# Patient Record
Sex: Female | Born: 2000 | Race: Black or African American | Hispanic: No | Marital: Single | Smoking: Never smoker
Health system: Southern US, Community
[De-identification: ages and names within clinical notes are randomized; demographics above are authoritative.]

## PROBLEM LIST (undated history)

## (undated) DIAGNOSIS — F640 Transsexualism: Secondary | ICD-10-CM

## (undated) DIAGNOSIS — A0472 Enterocolitis due to Clostridium difficile, not specified as recurrent: Secondary | ICD-10-CM

## (undated) DIAGNOSIS — Z789 Other specified health status: Secondary | ICD-10-CM

## (undated) DIAGNOSIS — K828 Other specified diseases of gallbladder: Secondary | ICD-10-CM

## (undated) DIAGNOSIS — F419 Anxiety disorder, unspecified: Secondary | ICD-10-CM

## (undated) DIAGNOSIS — E538 Deficiency of other specified B group vitamins: Secondary | ICD-10-CM

## (undated) DIAGNOSIS — F322 Major depressive disorder, single episode, severe without psychotic features: Secondary | ICD-10-CM

## (undated) DIAGNOSIS — J45909 Unspecified asthma, uncomplicated: Secondary | ICD-10-CM

## (undated) DIAGNOSIS — F909 Attention-deficit hyperactivity disorder, unspecified type: Secondary | ICD-10-CM

## (undated) DIAGNOSIS — F0781 Postconcussional syndrome: Secondary | ICD-10-CM

## (undated) HISTORY — PX: ULNAR NERVE REPAIR: SHX2594

## (undated) HISTORY — DX: Anxiety disorder, unspecified: F41.9

## (undated) HISTORY — DX: Enterocolitis due to Clostridium difficile, not specified as recurrent: A04.72

## (undated) HISTORY — DX: Other specified diseases of gallbladder: K82.8

## (undated) HISTORY — DX: Attention-deficit hyperactivity disorder, unspecified type: F90.9

## (undated) HISTORY — DX: Deficiency of other specified B group vitamins: E53.8

## (undated) HISTORY — DX: Postconcussional syndrome: F07.81

## (undated) HISTORY — PX: OTHER SURGICAL HISTORY: SHX169

## (undated) HISTORY — PX: LIGAMENT REPAIR: SHX5444

## (undated) HISTORY — PX: CHOLECYSTECTOMY: SHX55

## (undated) HISTORY — DX: Major depressive disorder, single episode, severe without psychotic features: F32.2

## (undated) HISTORY — PX: WISDOM TOOTH EXTRACTION: SHX21

---
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
## 2018-03-23 ENCOUNTER — Encounter (HOSPITAL_COMMUNITY): Payer: Self-pay | Admitting: *Deleted

## 2018-03-23 ENCOUNTER — Other Ambulatory Visit: Payer: Self-pay

## 2018-03-23 ENCOUNTER — Emergency Department (HOSPITAL_COMMUNITY)
Admission: EM | Admit: 2018-03-23 | Discharge: 2018-03-23 | Disposition: A | Discharge status: Home / Self Care | Payer: BLUE CROSS/BLUE SHIELD | Attending: Emergency Medicine | Admitting: Emergency Medicine

## 2018-03-23 DIAGNOSIS — Y9389 Activity, other specified: Secondary | ICD-10-CM | POA: Diagnosis not present

## 2018-03-23 DIAGNOSIS — Z79899 Other long term (current) drug therapy: Secondary | ICD-10-CM | POA: Insufficient documentation

## 2018-03-23 DIAGNOSIS — Y999 Unspecified external cause status: Secondary | ICD-10-CM | POA: Diagnosis not present

## 2018-03-23 DIAGNOSIS — W208XXA Other cause of strike by thrown, projected or falling object, initial encounter: Secondary | ICD-10-CM | POA: Insufficient documentation

## 2018-03-23 DIAGNOSIS — S060X0A Concussion without loss of consciousness, initial encounter: Secondary | ICD-10-CM | POA: Insufficient documentation

## 2018-03-23 DIAGNOSIS — J45909 Unspecified asthma, uncomplicated: Secondary | ICD-10-CM | POA: Insufficient documentation

## 2018-03-23 DIAGNOSIS — S0990XA Unspecified injury of head, initial encounter: Secondary | ICD-10-CM | POA: Diagnosis present

## 2018-03-23 DIAGNOSIS — Y929 Unspecified place or not applicable: Secondary | ICD-10-CM | POA: Diagnosis not present

## 2018-03-23 NOTE — Discharge Instructions (Signed)
Tylenol or ibuprofen for headaches.  Dont take it to prevent you from getting a headache, only if you have one. Return for confusion, vomiting

## 2018-03-23 NOTE — ED Triage Notes (Signed)
Pt reports she was carrying a table over the right shoulder and when she was laying it down she hit the right side of her head. No n/v or LOC, change in vision. She reports she does feel fatigued and dizzy

## 2018-03-23 NOTE — ED Provider Notes (Signed)
[facility] EMERGENCY DEPARTMENT Provider Note   CSN: [id number] Arrival date & time: 03/23/18  1927     History   Chief Complaint Chief Complaint  Patient presents with  . Head Injury    HPI Saralynn Langhorst is a 17 y.o. female.  17 yo F with a chief complaint of a closed head injury.  The patient was carrying a table and she sat down and thinks that the table bumped against the side of her head.  This occurred about an hour and a half ago.  She is having a mild headache.  She was feeling tired and so she was concerned that she may have something wrong with her.  She denies alcohol or drug ingestion.  Denies confusion denies vomiting denies loss of consciousness.  She denies unilateral numbness or weakness.  The history is provided by the patient.  Head Injury   The incident occurred 1 to 2 hours ago. She came to the ER via walk-in. The injury mechanism was a direct blow. There was no loss of consciousness. There was no blood loss. The quality of the pain is described as dull. The pain is at a severity of 2/10. The pain is mild. The pain has been constant since the injury. Pertinent negatives include no vomiting. The treatment provided no relief.    Past Medical History:  Diagnosis Date  . Asthma     Patient Active Problem List   Diagnosis Date Noted  . MDD (major depressive disorder), single episode, severe , no psychosis (HCC) 08/18/2017    Past Surgical History:  Procedure Laterality Date  . brain cyst removal    . LIGAMENT REPAIR Left    radial     OB History   None      Home Medications    Prior to Admission medications   Medication Sig Start Date End Date Taking? Authorizing Provider  escitalopram (LEXAPRO) 20 MG tablet Take 1 tablet (20 mg total) by mouth daily. For depression 08/21/17   Armandina Stammer I, NP  loratadine (CLARITIN) 10 MG tablet Take 1 tablet (10 mg total) by mouth daily. (May buy from over the counter): For allergies 08/21/17    Armandina Stammer I, NP  traZODone (DESYREL) 50 MG tablet Take 1 tablet (50 mg total) by mouth at bedtime as needed for sleep. 08/20/17   Sanjuana Kava, NP    Family History No family history on file.  Social History Social History   Tobacco Use  . Smoking status: Never Smoker  . Smokeless tobacco: Never Used  Substance Use Topics  . Alcohol use: No    Frequency: Never  . Drug use: No     Allergies   Mold extract [trichophyton] and Zyrtec [cetirizine]   Review of Systems Review of Systems  Constitutional: Negative for chills and fever.  HENT: Negative for congestion and rhinorrhea.   Eyes: Negative for redness and visual disturbance.  Respiratory: Negative for shortness of breath and wheezing.   Cardiovascular: Negative for chest pain and palpitations.  Gastrointestinal: Negative for nausea and vomiting.  Genitourinary: Negative for dysuria and urgency.  Musculoskeletal: Negative for arthralgias and myalgias.  Skin: Negative for pallor and wound.  Neurological: Positive for headaches. Negative for dizziness.     Physical Exam Updated Vital Signs BP (!) 126/94 (BP Location: Right Arm)   Pulse 70   Temp 98.9 F (37.2 C) (Oral)   Resp 16   LMP 03/22/2018   SpO2 98%   Physical  Exam  Constitutional: She is oriented to person, place, and time. She appears well-developed and well-nourished. No distress.  HENT:  Head: Normocephalic and atraumatic. Head is without raccoon's eyes and without Battle's sign.  No hemotympanum  Eyes: Pupils are equal, round, and reactive to light. EOM are normal.  Neck: Normal range of motion. Neck supple.  Cardiovascular: Normal rate and regular rhythm. Exam reveals no gallop and no friction rub.  No murmur heard. Pulmonary/Chest: Effort normal. She has no wheezes. She has no rales.  Abdominal: Soft. She exhibits no distension and no mass. There is no tenderness. There is no guarding.  Musculoskeletal: She exhibits no edema or tenderness.    Neurological: She is alert and oriented to person, place, and time. She has normal strength. No cranial nerve deficit or sensory deficit. She displays a negative Romberg sign. Coordination and gait normal.  Benign neuro exam  Skin: Skin is warm and dry. She is not diaphoretic.  Psychiatric: She has a normal mood and affect. Her behavior is normal.  Nursing note and vitals reviewed.    ED Treatments / Results  Labs (all labs ordered are listed, but only abnormal results are displayed) Labs Reviewed - No data to display  EKG None  Radiology No results found.  Procedures Procedures (including critical care time)  Medications Ordered in ED Medications - No data to display   Initial Impression / Assessment and Plan / ED Course  I have reviewed the triage vital signs and the nursing notes.  Pertinent labs & imaging results that were available during my care of the patient were reviewed by me and considered in my medical decision making (see chart for details).     17 yo F with a chief complaint of a closed head injury.  Cleared by [place] head CT rules.  Discharge home.  7:48 PM:  I have discussed the diagnosis/risks/treatment options with the patient and friends and believe the pt to be eligible for discharge home to follow-up with PCP. We also discussed returning to the ED immediately if new or worsening sx occur. We discussed the sx which are most concerning (e.g., confusion, vomiting) that necessitate immediate return. Medications administered to the patient during their visit and any new prescriptions provided to the patient are listed below.  Medications given during this visit Medications - No data to display    The patient appears reasonably screen and/or stabilized for discharge and I doubt any other medical condition or other [facility] requiring further screening, evaluation, or treatment in the ED at this time prior to discharge.    Final Clinical Impressions(s) / ED  Diagnoses   Final diagnoses:  Concussion without loss of consciousness, initial encounter    ED Discharge Orders    None       Melene PlanFloyd, Tana Trefry, DO 03/23/18 1948

## 2018-07-20 ENCOUNTER — Emergency Department (HOSPITAL_COMMUNITY): Payer: BLUE CROSS/BLUE SHIELD

## 2018-07-20 ENCOUNTER — Emergency Department (HOSPITAL_COMMUNITY)
Admission: EM | Admit: 2018-07-20 | Discharge: 2018-07-20 | Disposition: A | Discharge status: Home / Self Care | Payer: BLUE CROSS/BLUE SHIELD | Attending: Emergency Medicine | Admitting: Emergency Medicine

## 2018-07-20 ENCOUNTER — Other Ambulatory Visit: Payer: Self-pay

## 2018-07-20 ENCOUNTER — Encounter (HOSPITAL_COMMUNITY): Payer: Self-pay | Admitting: Emergency Medicine

## 2018-07-20 DIAGNOSIS — Y9351 Activity, roller skating (inline) and skateboarding: Secondary | ICD-10-CM | POA: Diagnosis not present

## 2018-07-20 DIAGNOSIS — M25562 Pain in left knee: Secondary | ICD-10-CM | POA: Diagnosis not present

## 2018-07-20 DIAGNOSIS — S99912A Unspecified injury of left ankle, initial encounter: Secondary | ICD-10-CM | POA: Diagnosis present

## 2018-07-20 DIAGNOSIS — S93402A Sprain of unspecified ligament of left ankle, initial encounter: Secondary | ICD-10-CM | POA: Insufficient documentation

## 2018-07-20 DIAGNOSIS — Y929 Unspecified place or not applicable: Secondary | ICD-10-CM | POA: Insufficient documentation

## 2018-07-20 DIAGNOSIS — J45909 Unspecified asthma, uncomplicated: Secondary | ICD-10-CM | POA: Insufficient documentation

## 2018-07-20 DIAGNOSIS — Z79899 Other long term (current) drug therapy: Secondary | ICD-10-CM | POA: Insufficient documentation

## 2018-07-20 DIAGNOSIS — Y999 Unspecified external cause status: Secondary | ICD-10-CM | POA: Insufficient documentation

## 2018-07-20 MED ORDER — IBUPROFEN 800 MG PO TABS: 800.0000 mg | ORAL_TABLET | Freq: Three times a day (TID) | ORAL | 0 refills | Status: DC

## 2018-07-20 NOTE — ED Triage Notes (Signed)
Patient complaining of falling down on left knee and twisting left ankle while trying to do tricks on a skateboard.

## 2018-07-20 NOTE — ED Provider Notes (Signed)
[facility]-EMERGENCY DEPT Provider Note   CSN: [id number] Arrival date & time: 07/20/18  0047    History   Chief Complaint Chief Complaint  Patient presents with  . Ankle Injury  . Knee Injury    HPI [name] is a 18 y.o. female.     Patient presents to the emergency department with a chief complaint of skateboarding injury.  She fell while skateboarding earlier this evening.  She complains of left knee and left ankle pain.  She has been able to ambulate, but states that it is painful.  Denies any treatments prior to arrival.  Denies any numbness, weakness, tingling.  Denies any other associated symptoms.  The history is provided by the patient. No language interpreter was used.    Past Medical History:  Diagnosis Date  . Asthma     Patient Active Problem List   Diagnosis Date Noted  . MDD (major depressive disorder), single episode, severe , no psychosis (HCC) 08/18/2017    Past Surgical History:  Procedure Laterality Date  . brain cyst removal    . LIGAMENT REPAIR Left    radial     OB History   No obstetric history on file.      Home Medications    Prior to Admission medications   Medication Sig Start Date End Date Taking? Authorizing Provider  escitalopram (LEXAPRO) 20 MG tablet Take 1 tablet (20 mg total) by mouth daily. For depression 08/21/17   Armandina Stammer I, NP  ibuprofen (ADVIL,MOTRIN) 800 MG tablet Take 1 tablet (800 mg total) by mouth 3 (three) times daily. 07/20/18   Roxy Horseman, PA-C  loratadine (CLARITIN) 10 MG tablet Take 1 tablet (10 mg total) by mouth daily. (May buy from over the counter): For allergies 08/21/17   Armandina Stammer I, NP  traZODone (DESYREL) 50 MG tablet Take 1 tablet (50 mg total) by mouth at bedtime as needed for sleep. 08/20/17   Sanjuana Kava, NP    Family History History reviewed. No pertinent family history.  Social History Social History   Tobacco Use  . Smoking status: Never Smoker  .  Smokeless tobacco: Never Used  Substance Use Topics  . Alcohol use: No    Frequency: Never  . Drug use: No     Allergies   Mold extract [trichophyton]; Pollen extract; and Zyrtec [cetirizine]   Review of Systems Review of Systems  All other systems reviewed and are negative.    Physical Exam Updated Vital Signs BP (!) 121/93 (BP Location: Left Arm)   Pulse 60   Temp 98.8 F (37.1 C) (Oral)   Resp 16   Ht 5\' 10"  (1.778 m)   Wt 95.3 kg   LMP 06/28/2018   SpO2 100%   BMI 30.13 kg/m   Physical Exam Nursing note and vitals reviewed.  Constitutional: Pt appears well-developed and well-nourished. No distress.  HENT:  Head: Normocephalic and atraumatic.  Eyes: Conjunctivae are normal.  Neck: Normal range of motion.  Cardiovascular: Normal rate, regular rhythm. Intact distal pulses.   Capillary refill < 3 sec.  Pulmonary/Chest: Effort normal and breath sounds normal.  Musculoskeletal:  Left lower extremity Pt exhibits no bony abnormality or deformity about the left knee or ankle, but there is tenderness about the left ankle on the medial and lateral aspects.   ROM: Limited in the left ankle, normal and left knee  Strength: 5/5 but painful Neurological: Pt  is alert. Coordination normal.  Sensation: 5/5 Skin: Skin  is warm and dry. Pt is not diaphoretic.  No evidence of open wound or skin tenting Psychiatric: Pt has a normal mood and affect.     ED Treatments / Results  Labs (all labs ordered are listed, but only abnormal results are displayed) Labs Reviewed - No data to display  EKG None  Radiology Dg Tibia/fibula Left  Result Date: 07/20/2018 CLINICAL DATA:  Injury EXAM: LEFT TIBIA AND FIBULA - 2 VIEW COMPARISON:  None. FINDINGS: The ankle is non included. There is no evidence of fracture or other focal bone lesions. Soft tissues are unremarkable. IMPRESSION: Negative. Electronically Signed   By: Jasmine Pang M.D.   On: 07/20/2018 01:59   Dg Ankle Complete  Left  Result Date: 07/20/2018 CLINICAL DATA:  Injury EXAM: LEFT ANKLE COMPLETE - 3+ VIEW COMPARISON:  None. FINDINGS: There is no evidence of fracture, dislocation, or joint effusion. There is no evidence of arthropathy or other focal bone abnormality. Soft tissues are unremarkable. IMPRESSION: Negative. Electronically Signed   By: Jasmine Pang M.D.   On: 07/20/2018 01:59   Dg Knee Complete 4 Views Left  Result Date: 07/20/2018 CLINICAL DATA:  Injury EXAM: LEFT KNEE - COMPLETE 4+ VIEW COMPARISON:  None. FINDINGS: No evidence of fracture, dislocation, or joint effusion. No evidence of arthropathy or other focal bone abnormality. Soft tissues are unremarkable. IMPRESSION: Negative. Electronically Signed   By: Jasmine Pang M.D.   On: 07/20/2018 01:59    Procedures Procedures (including critical care time)  Medications Ordered in ED Medications - No data to display   Initial Impression / Assessment and Plan / ED Course  I have reviewed the triage vital signs and the nursing notes.  Pertinent labs & imaging results that were available during my care of the patient were reviewed by me and considered in my medical decision making (see chart for details).        Patient presents with injury to left ankle and left knee.  DDx includes, fracture, strain, or sprain.  Consultants: None  Plain films reveal negative.  Pt advised to follow up with PCP and/or orthopedics. Patient given ankle ASO and crutches while in ED, conservative therapy such as RICE recommended and discussed.   Patient will be discharged home & is agreeable with above plan. Returns precautions discussed. Pt appears safe for discharge.   Final Clinical Impressions(s) / ED Diagnoses   Final diagnoses:  Sprain of left ankle, unspecified ligament, initial encounter  Acute pain of left knee    ED Discharge Orders         Ordered    ibuprofen (ADVIL,MOTRIN) 800 MG tablet  3 times daily     07/20/18 0233             Roxy Horseman, PA-C 07/20/18 0235    Ward, Layla Maw, DO 07/20/18 [phone]

## 2018-08-16 ENCOUNTER — Encounter (HOSPITAL_COMMUNITY): Payer: Self-pay

## 2018-08-16 ENCOUNTER — Other Ambulatory Visit: Payer: Self-pay

## 2018-08-16 ENCOUNTER — Emergency Department (HOSPITAL_COMMUNITY): Payer: BLUE CROSS/BLUE SHIELD

## 2018-08-16 ENCOUNTER — Emergency Department (HOSPITAL_COMMUNITY)
Admission: EM | Admit: 2018-08-16 | Discharge: 2018-08-16 | Disposition: A | Discharge status: Home / Self Care | Payer: BLUE CROSS/BLUE SHIELD | Attending: Emergency Medicine | Admitting: Emergency Medicine

## 2018-08-16 DIAGNOSIS — Z79899 Other long term (current) drug therapy: Secondary | ICD-10-CM | POA: Insufficient documentation

## 2018-08-16 DIAGNOSIS — S93402A Sprain of unspecified ligament of left ankle, initial encounter: Secondary | ICD-10-CM | POA: Diagnosis not present

## 2018-08-16 DIAGNOSIS — Y929 Unspecified place or not applicable: Secondary | ICD-10-CM | POA: Diagnosis not present

## 2018-08-16 DIAGNOSIS — S99912A Unspecified injury of left ankle, initial encounter: Secondary | ICD-10-CM | POA: Diagnosis present

## 2018-08-16 DIAGNOSIS — Z8709 Personal history of other diseases of the respiratory system: Secondary | ICD-10-CM | POA: Insufficient documentation

## 2018-08-16 DIAGNOSIS — M25562 Pain in left knee: Secondary | ICD-10-CM | POA: Diagnosis not present

## 2018-08-16 DIAGNOSIS — Y999 Unspecified external cause status: Secondary | ICD-10-CM | POA: Diagnosis not present

## 2018-08-16 DIAGNOSIS — Y9351 Activity, roller skating (inline) and skateboarding: Secondary | ICD-10-CM | POA: Insufficient documentation

## 2018-08-16 MED ORDER — ACETAMINOPHEN 500 MG PO TABS
1000.0000 mg | ORAL_TABLET | Freq: Once | ORAL | Status: AC
  Administered 2018-08-16: 18:00:00 1000 mg via ORAL
  Filled 2018-08-16: qty 2

## 2018-08-16 NOTE — ED Triage Notes (Addendum)
Pt arrives due to fall of skateboard 30 minutes ago. Pt reports losing balance and falling. Pt heard crack in left ankle. Pt reports pain to left knee and left ankle. Pt denies passing out and denies head/neck, or back pain. Pt is alert and oriented. Pt able to wiggle toes.

## 2018-08-16 NOTE — ED Notes (Signed)
ED Provider at bedside. 

## 2018-08-16 NOTE — ED Notes (Signed)
Pt verbalized understanding of all d/c instructions and f/u information. Opportunity for questioning and answers provided. VSS. All belongings with pt at this time. Pt wheeled to lobby by this RN

## 2018-08-16 NOTE — ED Notes (Signed)
Patient reports she had recently sprained her L ankle and has both ASO ankle brace and crutches at home. Patient reports she would prefer to use her brace and crutches.

## 2018-08-16 NOTE — Discharge Instructions (Signed)
Follow up with your Primary Care Doctor as needed.   You can take Tylenol or Ibuprofen as directed for pain. You can alternate Tylenol and Ibuprofen every 4 hours. If you take Tylenol at 1pm, then you can take Ibuprofen at 5pm. Then you can take Tylenol again at 9pm. Do not exceed 4000 mg of tylenol a day. Do not exceed 800 mg of ibuprofen a day.     Return to the Emergency Department immediately for any worsening pain, redness/swelling of the ankle, gray or blue color to the toes, numbness/weakness of toes or foot, difficulty walking or any other worsening or concerning symptoms.    Ankle sprain Ankle sprain occurs when the ligaments that hold the ankle joint to get her are stretched or torn. It may take 4-6 weeks to heal.  For activity: Use crutches with nonweightbearing for the first few days. Then, you may walk on your ankles as the pain allows, or as instructed. Start gradually with weight bearing on the affected ankle. Once you can walk pain free, then try jogging. When you can run forwards, then you can try moving side to side. If you cannot walk without crutches in one week, you need a recheck by your Family Doctor.  If you do not have a family doctor to followup with, you can see the list of phone numbers below. Please call today to make a followup appointment.   RICE therapy:  Routine Care for injuries  Rest, Ice, Compression, Elevation (RICE)  Rest is needed to allow your body to heal. Routine activities can be resumed when comfortable. Injury tendons and bones can take up to 6 weeks to heal. Tendons are cordlike structures that attach muscles and bones.  Ice following an injury helps keep the swelling down and reduce the pain. Put ice in a plastic bag. Place a towel between your skin and the bag of ice. Leave the ice on for 15-20 minutes, 3-4 times a day. Do this while awake, for the first 24-48 hours. After that continue as directed by your caregiver.  Compression helps keep  swelling down. It also gives support and helps with discomfort. If any lasting bandage has been applied, it should be removed and reapplied every 3-4 hours. It should not be applied tightly, but firmly enough to keep swelling down. Watch fingers or toes for swelling, discoloration, coldness, numbness or excessive pain. If any of these problems occur, removed the bandage and reapply loosely. Contact your caregiver if these problems continue.  Elevation helps reduce swelling and decrease your pain. With extremities such as the arms, hands, legs and feet, the injured area should be placed near or above the level of the heart if possible.   

## 2018-08-16 NOTE — ED Provider Notes (Signed)
[facility] Provider [facility]   CSN: [id number] Arrival date & time: 08/16/18  1640    History   Chief Complaint Chief Complaint  Patient presents with  . Ankle Pain    HPI [name] is a 18 y.o. female with PMH/o Asthma presents for evaluation of left ankle pain after mechanical fall off a skateboard patient reports that she was states that she fell.  She thinks she bent her foot forward.  She states that since then, she is not been able to ambulate or bear weight left ankle.  Patient reports she is now wearing a helmet.  She is not hit her head.  She denies any LOC.  She did hit her face and has a little bit of a swollen lip.  States that 1 of her tooth feels slightly loose but dentition appears intact.  Patient states that she is not currently on blood thinners.  She states she has some tingling sensation noted to the bottom of her left foot.  Patient states she has not any vision changes, neck pain, chest pain, difficulty breathing, abdominal pain, nausea/vomiting.     The history is provided by the patient.    Past Medical History:  Diagnosis Date  . Asthma     Patient Active Problem List   Diagnosis Date Noted  . MDD (major depressive disorder), single episode, severe , no psychosis (HCC) 08/18/2017    Past Surgical History:  Procedure Laterality Date  . brain cyst removal    . LIGAMENT REPAIR Left    radial     OB History   No obstetric history on file.      Home Medications    Prior to Admission medications   Medication Sig Start Date End Date Taking? Authorizing Provider  albuterol (PROVENTIL HFA;VENTOLIN HFA) 108 (90 Base) MCG/ACT inhaler Inhale 2 puffs into the lungs every 6 (six) hours as needed for wheezing or shortness of breath.   Yes [provider]  DULoxetine (CYMBALTA) 30 MG capsule Take 1 capsule by mouth daily. 08/11/18  Yes [provider]  loratadine (CLARITIN) 10 MG tablet Take 1 tablet  (10 mg total) by mouth daily. (May buy from over the counter): For allergies 08/21/17  Yes Nwoko, Nicole KindredAgnes I, NP  Melatonin CR 3 MG TBCR Take 1 tablet by mouth daily as needed for sleep.   Yes [provider]  rizatriptan (MAXALT) 10 MG tablet Take 1 tablet by mouth daily as needed for migraine. 05/25/18  Yes [provider]  testosterone cypionate (DEPOTESTOSTERONE CYPIONATE) 200 MG/ML injection Inject 0.25 mLs into the muscle once a week. 08/11/18  Yes [provider]  traZODone (DESYREL) 50 MG tablet Take 1 tablet (50 mg total) by mouth at bedtime as needed for sleep. 08/20/17  Yes Armandina StammerNwoko, Agnes I, NP  vitamin B-12 (CYANOCOBALAMIN) 1000 MCG tablet Take 1 tablet by mouth daily. 04/04/18 05/25/19 Yes [provider]  escitalopram (LEXAPRO) 20 MG tablet Take 1 tablet (20 mg total) by mouth daily. For depression Patient not taking: Reported on 08/16/2018 08/21/17   Armandina StammerNwoko, Agnes I, NP  ibuprofen (ADVIL,MOTRIN) 800 MG tablet Take 1 tablet (800 mg total) by mouth 3 (three) times daily. Patient not taking: Reported on 08/16/2018 07/20/18   Roxy HorsemanBrowning, Robert, PA-C    Family History History reviewed. No pertinent family history.  Social History Social History   Tobacco Use  . Smoking status: Never Smoker  . Smokeless tobacco: Never Used  Substance Use Topics  .  Alcohol use: No    Frequency: Never  . Drug use: No     Allergies   Mold extract [trichophyton]; Pollen extract; and Zyrtec [cetirizine]   Review of Systems Review of Systems  Eyes: Negative for visual disturbance.  Respiratory: Negative for shortness of breath.   Cardiovascular: Negative for chest pain.  Gastrointestinal: Negative for abdominal pain, nausea and vomiting.  Musculoskeletal:       Left ankle and knee pain  Neurological: Positive for numbness.  All other systems reviewed and are negative.    Physical Exam Updated Vital Signs BP 122/89 (BP Location: Right Arm)   Pulse (!) 111   Temp  98.6 F (37 C) (Oral)   Resp 18   LMP 07/24/2018   SpO2 100%   Physical Exam Vitals signs and nursing note reviewed.  Constitutional:      Appearance: She is well-developed.  HENT:     Head: Normocephalic and atraumatic.     Comments: No tenderness palpation noted to face.  No deformity or crepitus noted.  No tenderness noted to mandible or nose.    Nose: Nose normal. No nasal deformity or nasal tenderness.     Mouth/Throat:     Comments: Dentition appears intact.  Eyes:     Comments: PERRL, EOMs intact.   Neck:     Musculoskeletal: Normal range of motion.  Cardiovascular:     Pulses:          Dorsalis pedis pulses are 2+ on the right side and 2+ on the left side.  Pulmonary:     Effort: Pulmonary effort is normal.  Musculoskeletal:     Comments: Tenderness to palpation to the dorsal aspect of the left ankle ankle.  Mild overlying soft tissue swelling.  No verlying ecchymosis, warmth, or erythema. No deformity or crepitus noted.  No deficit noted on palpation of Achilles tendon.  Skin:    General: Skin is warm and dry.     Capillary Refill: Capillary refill takes less than 2 seconds.     Comments: The skin is intact to ankle/foot.  The foot is warm and well perfused with intact sensation  Neurological:     Comments: Decreased sensation in the plantar surface of left foot.  Otherwise sensation intact throughout all major nerve distributions of the feet        ED Treatments / Results  Labs (all labs ordered are listed, but only abnormal results are displayed) Labs Reviewed - No data to display  EKG None  Radiology Dg Ankle Complete Left  Result Date: 08/16/2018 CLINICAL DATA:  Pain following fall EXAM: LEFT ANKLE COMPLETE - 3+ VIEW COMPARISON:  July 20, 2018 FINDINGS: Frontal, lateral, and oblique views were obtained. No fracture or joint effusion. No joint space narrowing or erosion. Ankle mortise appears intact. IMPRESSION: No fracture or apparent arthropathy.  Ankle  mortise appears intact. Electronically Signed   By: Bretta Bang III M.D.   On: 08/16/2018 18:11   Dg Knee Complete 4 Views Left  Result Date: 08/16/2018 CLINICAL DATA:  Pain following fall EXAM: LEFT KNEE - COMPLETE 4+ VIEW COMPARISON:  July 20, 2018 FINDINGS: Frontal, lateral, and bilateral oblique views were obtained. There is no appreciable fracture or dislocation. No joint effusion. There is no appreciable joint space narrowing or erosion. IMPRESSION: No fracture or dislocation. No appreciable joint effusion. No evident arthropathy. Electronically Signed   By: Bretta Bang III M.D.   On: 08/16/2018 18:10    Procedures Procedures (including critical  care time)  Medications Ordered in ED Medications  acetaminophen (TYLENOL) tablet 1,000 mg (1,000 mg Oral Given 08/16/18 1757)     Initial Impression / Assessment and Plan / ED Course  I have reviewed the triage vital signs and the nursing notes.  Pertinent labs & imaging results that were available during my care of the patient were reviewed by me and considered in my medical decision making (see chart for details).       18 year-old female who presents for evaluation of left ankle pain after mechanical fall off a skateboard just prior to ED arrival.  Injury, LOC.  She does report hitting her upper lip.  She says when she may feel little bit loose but dentition otherwise normal intact.  Normal neuro exam.  No tenderness palpation of the mandible, nose. Given reassuring physical exam and per [facility] CT criteria, no imaging is indicated at this time.  No indication for CT of maxillofacial.  Palpation noted to left ankle.  Concern for fracture versus dislocation. Patient is afebrile, non-toxic appearing, sitting comfortably on examination table. Vital signs reviewed and stable.   X-rays reviewed.  Negative for any acute bony abnormality.  Discussed results with patient.  We will plan to treat as sprain.  Patient placed in ASO  splint and crutches.  Encouraged at home supportive care measures. At this time, patient exhibits no emergent life-threatening condition that require further evaluation in ED or admission. Patient had ample opportunity for questions and discussion. All patient's questions were answered with full understanding. Strict return precautions discussed. Patient expresses understanding and agreement to plan.   Portions of this note were generated with Scientist, clinical (histocompatibility and immunogenetics). Dictation errors may occur despite best attempts at proofreading.   Final Clinical Impressions(s) / ED Diagnoses   Final diagnoses:  Sprain of left ankle, unspecified ligament, initial encounter    ED Discharge Orders    None       Rosana Hoes 08/16/18 2217    Shaune Pollack, MD 08/17/18 [phone]

## 2018-08-16 NOTE — ED Notes (Signed)
Pt. returned from XR. 

## 2018-08-16 NOTE — ED Notes (Signed)
Patient transported to X-ray 

## 2018-08-23 ENCOUNTER — Encounter: Payer: Self-pay | Admitting: Sports Medicine

## 2018-08-23 ENCOUNTER — Other Ambulatory Visit: Payer: Self-pay

## 2018-08-23 ENCOUNTER — Ambulatory Visit (INDEPENDENT_AMBULATORY_CARE_PROVIDER_SITE_OTHER): Payer: BLUE CROSS/BLUE SHIELD | Admitting: Sports Medicine

## 2018-08-23 VITALS — BP 127/83 | HR 97 | Temp 98.4°F | Ht 70.0 in | Wt 220.0 lb

## 2018-08-23 DIAGNOSIS — S93492A Sprain of other ligament of left ankle, initial encounter: Secondary | ICD-10-CM

## 2018-08-23 DIAGNOSIS — S8002XA Contusion of left knee, initial encounter: Secondary | ICD-10-CM

## 2018-08-23 NOTE — Patient Instructions (Signed)
  Buy some over-the-counter Aleve and take 2 pills twice daily with food for the next 5 days.  Stop taking your ibuprofen  Continue wearing your ankle brace when active  It is okay to discontinue the crutches as your ankle pain improves  Start doing your rehabilitation exercises daily and follow-up with your primary care/sports medicine physician in about 3 weeks

## 2018-08-24 ENCOUNTER — Encounter: Payer: Self-pay | Admitting: Sports Medicine

## 2018-08-24 NOTE — Progress Notes (Signed)
Subjective:    Patient ID: Cynthia PereyraKyra Bedingfield, female    DOB: 17-May-2000, 18 y.o.   MRN: [id number]  HPI chief complaint: Left ankle and left knee pain  Very pleasant 18 year old female comes in today after having injured her left knee and left ankle last week while skateboarding.  She fell from her skateboard twisting her left ankle and falling directly on the left knee.  She was seen in the emergency room where x-rays were obtained.  Those x-rays showed no obvious fracture.  She was discharged home with an ASO brace, crutches, and instructions to take Tylenol or over-the-counter ibuprofen as needed for pain.  She has been taking ibuprofen twice daily but continues to have pain.  Pain in the knee is mainly along the medial knee, just medial to the patella.  She did notice some swelling and bruising around the knee.  She has also noticed swelling around the ankle.  She localizes her ankle pain to the lateral aspect of the ankle.  No pain medially and no pain in her foot.  She does have a history of reoccurring significant ankle sprains but has had no prior ankle or knee surgery.  Past medical history is reviewed Medical history is reviewed Allergies reviewed    Review of Systems As above    Objective:   Physical Exam  Well-developed, well-nourished.  No acute distress.  Awake alert and oriented x3.  Vital signs reviewed  Left knee: Nearly full range of motion.  There is no effusion but she does have some mild soft tissue swelling in the anterior knee.  Mild ecchymosis along the medial knee, just medial to the patella.  She has a well-healing abrasion in the anterior knee with no signs of infection.  Knee is stable to anterior drawer and posterior drawer testing.  Knee is stable to valgus and varus stressing.  Negative McMurray's.  Extensor mechanism is intact.  Left ankle: Limited range of motion secondary to pain and swelling.  There is moderate swelling along the lateral ankle.  No effusion.   No ecchymosis.  She is tender to palpation directly over the ATF with a positive anterior drawer.  Talar tilt is difficult to evaluate due to swelling.  She has no tenderness to palpation at the posterior aspect of the distal fibula.  No tenderness over the medial malleolus, navicular, nor base of the fifth metatarsal.  Good pulses.  Skin is intact.  Ambulating with a limp and the assistance of crutches.  X-rays of both the left ankle and left knee are reviewed.  No obvious fracture seen.  No effusion.  Mortise view of the ankle is unremarkable.      Assessment & Plan:   Left ankle sprain Chronic ankle instability Left knee contusion  In regards to her current injuries, she may wean from her crutches as her symptoms allow.  She will remain in her ASO when active.  She will change her ibuprofen to Aleve with instructions to take 2 Aleve twice daily for the next 5 days and then as needed.  She is shown home exercises for the left ankle including range of motion exercises and strengthening exercises.  She will follow-up in 3 weeks with her PCP/sports medicine physician in [place].  I reassured her that her current injuries will heal but the bigger concern is the chronic ankle instability that she has had for several years.  She may need a stress x-ray in the near future to see whether or not she  is a candidate for ankle reconstruction.  She will follow-up with me as needed.

## 2019-06-26 ENCOUNTER — Emergency Department (HOSPITAL_COMMUNITY): Payer: BC Managed Care – PPO

## 2019-06-26 ENCOUNTER — Encounter (HOSPITAL_COMMUNITY): Payer: Self-pay

## 2019-06-26 ENCOUNTER — Emergency Department (HOSPITAL_COMMUNITY)
Admission: EM | Admit: 2019-06-26 | Discharge: 2019-06-26 | Disposition: A | Discharge status: Home / Self Care | Payer: BC Managed Care – PPO | Attending: Emergency Medicine | Admitting: Emergency Medicine

## 2019-06-26 ENCOUNTER — Other Ambulatory Visit: Payer: Self-pay

## 2019-06-26 DIAGNOSIS — M25531 Pain in right wrist: Secondary | ICD-10-CM | POA: Diagnosis not present

## 2019-06-26 DIAGNOSIS — J45909 Unspecified asthma, uncomplicated: Secondary | ICD-10-CM | POA: Diagnosis not present

## 2019-06-26 DIAGNOSIS — Z888 Allergy status to other drugs, medicaments and biological substances status: Secondary | ICD-10-CM | POA: Insufficient documentation

## 2019-06-26 DIAGNOSIS — F17210 Nicotine dependence, cigarettes, uncomplicated: Secondary | ICD-10-CM | POA: Diagnosis not present

## 2019-06-26 DIAGNOSIS — Z79899 Other long term (current) drug therapy: Secondary | ICD-10-CM | POA: Diagnosis not present

## 2019-06-26 MED ORDER — NAPROXEN 500 MG PO TABS: 500.0000 mg | ORAL_TABLET | Freq: Two times a day (BID) | ORAL | 0 refills | Status: AC

## 2019-06-26 NOTE — ED Triage Notes (Signed)
Patient reports that he was at work 3 days ago and began having right wrist pain. Patient states he has used ice, wrapping, and Ibuprofen with no relief.

## 2019-06-26 NOTE — Discharge Instructions (Addendum)
Your x-ray today was within normal limits.  We have placed your right wrist on a splint, please wear this for comfort at your convenience.  I have also provided a short prescription for anti-inflammatories, please take 1 tablet twice a day for the next 7 days to help with your symptoms.

## 2019-06-26 NOTE — ED Provider Notes (Signed)
[facility]   CSN: [id number] Arrival date & time: 06/26/19  1356     History Chief Complaint  Patient presents with  . Wrist Pain    Su Duma is a 19 y.o. female.  19 y.o female with a PMH of Asthma presents to the ED with a chief complaint of right wrist pain x 3 days. 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  She reports she is try elevation, compression, rest, ibuprofen without improvement in symptoms.  Patient describes the pain along the medial and lateral aspect of the right wrist, this is worse with flexion along with extension.  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  She denies any trauma, fevers, rashes, other complaints.  The history is provided by the patient and medical records.       Past Medical History:  Diagnosis Date  . Asthma     Patient Active Problem List   Diagnosis Date Noted  . MDD (major depressive disorder), single episode, severe , no psychosis ([facility]) 08/18/2017    Past Surgical History:  Procedure Laterality Date  . brain cyst removal    . CHOLECYSTECTOMY    . LIGAMENT REPAIR Left    radial  . ULNAR NERVE REPAIR       OB History   No obstetric history on file.     Family History  Family history unknown: Yes    Social History   Tobacco Use  . Smoking status: Current Every Day Smoker    Packs/day: 0.10    Types: Cigarettes  . Smokeless tobacco: Never Used  Substance Use Topics  . Alcohol use: Yes    Comment: occasionally  . Drug use: No    Home Medications Prior to Admission medications   Medication Sig Start Date End Date Taking? Authorizing Provider  albuterol (PROVENTIL HFA;VENTOLIN HFA) 108 (90 Base) MCG/ACT inhaler Inhale 2 puffs into the lungs every 6 (six) hours as needed for wheezing or shortness of breath.     [provider]  DULoxetine (CYMBALTA) 30 MG capsule Take 1 capsule by mouth daily. 08/11/18   [provider]  escitalopram (LEXAPRO) 20 MG tablet Take 1 tablet (20 mg total) by mouth daily. For depression Patient not taking: Reported on 08/16/2018 08/21/17   Armandina Stammer I, NP  ibuprofen (ADVIL,MOTRIN) 800 MG tablet Take 1 tablet (800 mg total) by mouth 3 (three) times daily. Patient not taking: Reported on 08/16/2018 07/20/18   Roxy Horseman, PA-C  loratadine (CLARITIN) 10 MG tablet Take 1 tablet (10 mg total) by mouth daily. (May buy from over the counter): For allergies 08/21/17   Armandina Stammer I, NP  Melatonin CR 3 MG TBCR Take 1 tablet by mouth daily as needed for sleep.    [provider]  naproxen (NAPROSYN) 500 MG tablet Take 1 tablet (500 mg total) by mouth 2 (two) times daily for 7 days. 06/26/19 07/03/19  Claude Manges, PA-C  rizatriptan (MAXALT) 10 MG tablet Take 1 tablet by mouth daily as needed for migraine. 05/25/18   [provider]  testosterone cypionate (DEPOTESTOSTERONE CYPIONATE) 200 MG/ML injection Inject 0.25 mLs into the muscle once a week. 08/11/18   [provider]  traZODone (DESYREL) 50 MG tablet Take 1 tablet (50 mg total) by mouth at bedtime as needed for sleep.  08/20/17   Encarnacion Slates, NP    Allergies    Mold extract [trichophyton], Pollen extract, and Zyrtec [cetirizine]  Review of Systems   Review of Systems  Constitutional: Negative for fever.  Musculoskeletal: Positive for arthralgias. Negative for joint swelling and myalgias.    Physical Exam Updated Vital Signs BP 125/70 (BP Location: Left Arm)   Pulse 62   Temp 98.4 F (36.9 C) (Oral)   Resp 16   Ht 5\' 11"  (1.803 m)   Wt 97.1 kg   SpO2 100%   BMI 29.85 kg/m   Physical Exam Vitals and nursing note reviewed.  Constitutional:      General: She is not in acute distress.    Appearance: She is well-developed.     Comments: Non toxic.   HENT:     Head:  Normocephalic and atraumatic.     Mouth/Throat:     Pharynx: No oropharyngeal exudate.  Eyes:     Pupils: Pupils are equal, round, and reactive to light.  Cardiovascular:     Rate and Rhythm: Regular rhythm.     Heart sounds: Normal heart sounds.  Pulmonary:     Effort: Pulmonary effort is normal. No respiratory distress.     Breath sounds: Normal breath sounds.  Abdominal:     General: Bowel sounds are normal. There is no distension.     Palpations: Abdomen is soft.     Tenderness: There is no abdominal tenderness.  Musculoskeletal:        General: No deformity.     Right wrist: Tenderness present. No swelling, deformity, bony tenderness or crepitus. Normal pulse.     Cervical back: Normal range of motion.     Right lower leg: No edema.     Left lower leg: No edema.     Comments: 2+ pulses present, full ROM with pain, no swelling to the joint area, No decrease in strength.   Skin:    General: Skin is warm and dry.  Neurological:     Mental Status: She is alert and oriented to person, place, and time.     ED Results / Procedures / Treatments   Labs (all labs ordered are listed, but only abnormal results are displayed) Labs Reviewed - No data to display  EKG None  Radiology DG Wrist Complete Right  Result Date: 06/26/2019 CLINICAL DATA:  Pain EXAM: RIGHT WRIST - COMPLETE 3+ VIEW COMPARISON:  None. FINDINGS: Alignment is anatomic. There is no acute fracture. Joint spaces are preserved. There is no intrinsic osseous lesion. IMPRESSION: No significant osseous abnormality. Electronically Signed   By: Macy Mis M.D.   On: 06/26/2019 14:47    Procedures Procedures (including critical care time)  Medications Ordered in ED Medications - No data to display  ED Course  I have reviewed the triage vital signs and the nursing notes.  Pertinent labs & imaging results that were available during my care of the patient were reviewed by me and considered in my medical decision  making (see chart for details).    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  She reports she is currently working at [employer], does a lot of pulling, carrying, pushing.  Has been pain with ranging of the right wrist.  Has been no trauma, fevers, rashes, prior history of gout.  My evaluation right  wrist appears neurovascularly intact, there is tenderness with palpation along the ulnar aspect and radial aspect although pulses are present.  Full range of motion with pain.  Xray of the wrist showed: No fracture, dislocation or other acute concern.  Results were discussed at length with patient, she was provided with a Velcro splint along with a short course of anti-inflammatories to help with her symptoms.  She is currently followed by [facility], will continue to do so for her right wrist pain.    Portions of this note were generated with Scientist, clinical (histocompatibility and immunogenetics). Dictation errors may occur despite best attempts at proofreading.  Final Clinical Impression(s) / ED Diagnoses Final diagnoses:  Right wrist pain    Rx / DC Orders ED Discharge Orders         Ordered    naproxen (NAPROSYN) 500 MG tablet  2 times daily     06/26/19 1453           Claude Manges, PA-C 06/26/19 1458    Cathren Laine, MD 06/27/19 1440

## 2019-06-26 NOTE — ED Notes (Signed)
An After Visit Summary was printed and given to the patient. Discharge instructions given and no further questions at this time.  

## 2019-07-20 ENCOUNTER — Encounter (HOSPITAL_COMMUNITY): Payer: Self-pay | Admitting: *Deleted

## 2019-07-20 ENCOUNTER — Other Ambulatory Visit: Payer: Self-pay

## 2019-07-20 ENCOUNTER — Emergency Department (HOSPITAL_COMMUNITY)
Admission: EM | Admit: 2019-07-20 | Discharge: 2019-07-20 | Disposition: A | Discharge status: Home / Self Care | Payer: BC Managed Care – PPO | Attending: Emergency Medicine | Admitting: Emergency Medicine

## 2019-07-20 DIAGNOSIS — Z5321 Procedure and treatment not carried out due to patient leaving prior to being seen by health care provider: Secondary | ICD-10-CM | POA: Insufficient documentation

## 2019-07-20 DIAGNOSIS — M25531 Pain in right wrist: Secondary | ICD-10-CM | POA: Insufficient documentation

## 2019-07-20 NOTE — ED Notes (Signed)
Pt left AMA. Pt was encouraged to stay and was aware of risks. Pt stated they would come back tomorrow.

## 2019-07-20 NOTE — ED Triage Notes (Signed)
Right wrist pain for two weeks, feeling "grinding, popping and pain". Seen for the same previously. Says today, dropped something on it, made the pain worse. Has been wearing splint without relief.

## 2019-08-09 ENCOUNTER — Encounter (HOSPITAL_COMMUNITY): Payer: Self-pay | Admitting: Emergency Medicine

## 2019-08-09 ENCOUNTER — Other Ambulatory Visit: Payer: Self-pay

## 2019-08-09 ENCOUNTER — Emergency Department (HOSPITAL_COMMUNITY): Payer: BC Managed Care – PPO

## 2019-08-09 ENCOUNTER — Emergency Department (HOSPITAL_COMMUNITY)
Admission: EM | Admit: 2019-08-09 | Discharge: 2019-08-09 | Disposition: A | Discharge status: Home / Self Care | Payer: BC Managed Care – PPO | Attending: Emergency Medicine | Admitting: Emergency Medicine

## 2019-08-09 DIAGNOSIS — R109 Unspecified abdominal pain: Secondary | ICD-10-CM

## 2019-08-09 DIAGNOSIS — Z79899 Other long term (current) drug therapy: Secondary | ICD-10-CM | POA: Insufficient documentation

## 2019-08-09 DIAGNOSIS — R112 Nausea with vomiting, unspecified: Secondary | ICD-10-CM | POA: Diagnosis present

## 2019-08-09 DIAGNOSIS — F641 Gender identity disorder in adolescence and adulthood: Secondary | ICD-10-CM | POA: Insufficient documentation

## 2019-08-09 LAB — URINALYSIS, ROUTINE W REFLEX MICROSCOPIC
Bilirubin Urine: NEGATIVE
Glucose, UA: NEGATIVE mg/dL
Hgb urine dipstick: NEGATIVE
Ketones, ur: NEGATIVE mg/dL
Nitrite: NEGATIVE
Protein, ur: NEGATIVE mg/dL
Specific Gravity, Urine: 1.01 (ref 1.005–1.030)
pH: 7 (ref 5.0–8.0)

## 2019-08-09 LAB — COMPREHENSIVE METABOLIC PANEL
ALT: 33 U/L (ref 0–44)
AST: 23 U/L (ref 15–41)
Albumin: 4.2 g/dL (ref 3.5–5.0)
Alkaline Phosphatase: 50 U/L (ref 38–126)
Anion gap: 7 (ref 5–15)
BUN: 10 mg/dL (ref 6–20)
CO2: 24 mmol/L (ref 22–32)
Calcium: 9 mg/dL (ref 8.9–10.3)
Chloride: 108 mmol/L (ref 98–111)
Creatinine, Ser: 0.93 mg/dL (ref 0.44–1.00)
GFR calc Af Amer: >60 mL/min (ref >60)
GFR calc non Af Amer: >60 mL/min (ref >60)
Glucose, Bld: 120 mg/dL — ABNORMAL HIGH (ref 70–99)
Potassium: 3.9 mmol/L (ref 3.5–5.1)
Sodium: 139 mmol/L (ref 135–145)
Total Bilirubin: 0.6 mg/dL (ref 0.3–1.2)
Total Protein: 7.3 g/dL (ref 6.5–8.1)

## 2019-08-09 LAB — CBC
HCT: 49.8 % — ABNORMAL HIGH (ref 36.0–46.0)
Hemoglobin: 16.1 g/dL — ABNORMAL HIGH (ref 12.0–15.0)
MCH: 27.9 pg (ref 26.0–34.0)
MCHC: 32.3 g/dL (ref 30.0–36.0)
MCV: 86.3 fL (ref 80.0–100.0)
Platelets: 196 10*3/uL (ref 150–400)
RBC: 5.77 MIL/uL — ABNORMAL HIGH (ref 3.87–5.11)
RDW: 12.8 % (ref 11.5–15.5)
WBC: 5.3 10*3/uL (ref 4.0–10.5)
nRBC: 0 % (ref 0.0–0.2)

## 2019-08-09 LAB — I-STAT BETA HCG BLOOD, ED (MC, WL, AP ONLY): I-stat hCG, quantitative: <5 m[IU]/mL (ref <5)

## 2019-08-09 LAB — LIPASE, BLOOD: Lipase: 24 U/L (ref 11–51)

## 2019-08-09 MED ORDER — SODIUM CHLORIDE 0.9% FLUSH: 3.0000 mL | Freq: Once | INTRAVENOUS | Status: DC

## 2019-08-09 MED ORDER — SODIUM CHLORIDE 0.9 % IV BOLUS
1000.0000 mL | Freq: Once | INTRAVENOUS | Status: AC
  Administered 2019-08-09: 1000 mL via INTRAVENOUS

## 2019-08-09 MED ORDER — AMOXICILLIN-POT CLAVULANATE 875-125 MG PO TABS
1.0000 | ORAL_TABLET | Freq: Once | ORAL | Status: AC
  Administered 2019-08-09: 1 via ORAL
  Filled 2019-08-09: qty 1

## 2019-08-09 MED ORDER — ONDANSETRON HCL 4 MG PO TABS: 4.0000 mg | ORAL_TABLET | Freq: Four times a day (QID) | ORAL | 0 refills | Status: DC | PRN

## 2019-08-09 MED ORDER — AMOXICILLIN-POT CLAVULANATE 875-125 MG PO TABS: 1.0000 | ORAL_TABLET | Freq: Two times a day (BID) | ORAL | 0 refills | Status: DC

## 2019-08-09 MED ORDER — SODIUM CHLORIDE (PF) 0.9 % IJ SOLN
INTRAMUSCULAR | Status: AC
  Filled 2019-08-09: qty 50

## 2019-08-09 MED ORDER — MORPHINE SULFATE (PF) 4 MG/ML IV SOLN
4.0000 mg | Freq: Once | INTRAVENOUS | Status: AC
  Administered 2019-08-09: 4 mg via INTRAVENOUS
  Filled 2019-08-09: qty 1

## 2019-08-09 MED ORDER — IOHEXOL 300 MG/ML  SOLN
100.0000 mL | Freq: Once | INTRAMUSCULAR | Status: AC | PRN
  Administered 2019-08-09: 100 mL via INTRAVENOUS

## 2019-08-09 MED ORDER — ONDANSETRON HCL 4 MG/2ML IJ SOLN
4.0000 mg | Freq: Once | INTRAMUSCULAR | Status: AC
  Administered 2019-08-09: 4 mg via INTRAVENOUS
  Filled 2019-08-09: qty 2

## 2019-08-09 NOTE — ED Notes (Signed)
Provided pt with ginger ale, will continue to monitor

## 2019-08-09 NOTE — ED Notes (Signed)
Pt denies nausea or any further episodes of emesis at this time

## 2019-08-09 NOTE — ED Provider Notes (Addendum)
[facility] DEPT Provider Note   CSN: [id number] Arrival date & time: 08/09/19  1433     History Chief Complaint  Patient presents with  . Nausea  . Emesis    [name] is a 19 y.o. transgender female to female with past medical history significant for asthma, major depressive disorder presents to emergency department today with chief complaint of progressively worsening nausea and vomiting x3 days.  Patient states he has been unable to tolerate any p.o. intake secondary to nausea.  He has had 3 episodes of nonbloody nonbilious emesis in the last 24 hours as well as too numerous episodes to count of diarrhea.  Patient is also having constant left-sided abdominal pain that he describes as aching and throbbing sensation.  He rates the pain 7 out of 10 in severity. Patient endorses subjective fever and chills.  No medications for symptoms prior to arrival.  Denies cough, congestion, shortness of breath, chest pain, urinary symptoms, blood in stool, loss of sense of taste and smell.  Patient no longer has menstrual cycle.  Denies any vaginal discharge, pelvic pain or abnormal vaginal bleeding.  Also denies any recent travel or antibiotic use. Patient had outpatient Covid test yesterday that was negative.  Denies any sick contacts or contact with anyone known positive for Covid.  Patient does admit to smoking marijuana daily however has not smoked in the last week because he has felt so poorly. Abdominal surgical history includes cholecystectomy.    Past Medical History:  Diagnosis Date  . Asthma     Patient Active Problem List   Diagnosis Date Noted  . MDD (major depressive disorder), single episode, severe , no psychosis ([facility]) 08/18/2017    Past Surgical History:  Procedure Laterality Date  . brain cyst removal    . CHOLECYSTECTOMY    . LIGAMENT REPAIR Left    radial  . ULNAR NERVE REPAIR       OB History   No obstetric history on file.      Family History  Family history unknown: Yes    Social History   Tobacco Use  . Smoking status: Current Every Day Smoker    Packs/day: 0.10    Types: Cigarettes  . Smokeless tobacco: Never Used  Substance Use Topics  . Alcohol use: Yes    Comment: occasionally  . Drug use: No    Home Medications Prior to Admission medications   Medication Sig Start Date End Date Taking? Authorizing Provider  albuterol (PROVENTIL HFA;VENTOLIN HFA) 108 (90 Base) MCG/ACT inhaler Inhale 2 puffs into the lungs every 6 (six) hours as needed for wheezing or shortness of breath.   Yes [provider]  bismuth subsalicylate (PEPTO BISMOL) 262 MG/15ML suspension Take 15 mLs by mouth 2 (two) times daily as needed for diarrhea or loose stools.   Yes [provider]  cyanocobalamin 1000 MCG tablet Take 1,000 mcg by mouth daily.  04/04/18  Yes [provider]  ibuprofen (ADVIL,MOTRIN) 800 MG tablet Take 1 tablet (800 mg total) by mouth 3 (three) times daily. Patient taking differently: Take 800 mg by mouth daily as needed.  07/20/18  Yes Montine Circle, PA-C  loratadine (CLARITIN) 10 MG tablet Take 1 tablet (10 mg total) by mouth daily. (May buy from over the counter): For allergies Patient taking differently: Take 10 mg by mouth daily.  08/21/17  Yes Lindell Spar I, NP  Melatonin CR 3 MG TBCR Take 3 mg by mouth daily as needed (for  sleep).    Yes [provider]  testosterone cypionate (DEPOTESTOSTERONE CYPIONATE) 200 MG/ML injection Inject 0.375 mLs into the muscle once a week.  08/11/18  Yes [provider]  traZODone (DESYREL) 50 MG tablet Take 1 tablet (50 mg total) by mouth at bedtime as needed for sleep. 08/20/17  Yes Armandina Stammer I, NP  amoxicillin-clavulanate (AUGMENTIN) 875-125 MG tablet Take 1 tablet by mouth every 12 (twelve) hours. 08/09/19   Sophiya Morello E, PA-C  escitalopram (LEXAPRO) 20 MG tablet Take 1 tablet (20 mg total) by mouth daily. For  depression Patient not taking: Reported on 08/16/2018 08/21/17   Armandina Stammer I, NP  ondansetron (ZOFRAN) 4 MG tablet Take 1 tablet (4 mg total) by mouth every 6 (six) hours as needed for nausea or vomiting. 08/09/19   Kemaya Dorner, Yvonna Alanis E, PA-C    Allergies    Bee pollen, Mold extract [trichophyton], Pollen extract, and Zyrtec [cetirizine]  Review of Systems   Review of Systems All other systems are reviewed and are negative for acute change except as noted in the HPI.  Physical Exam Updated Vital Signs BP (!) 146/98 (BP Location: Left Arm)   Pulse 70   Temp 98.1 F (36.7 C) (Oral)   Resp 16   SpO2 100%   Physical Exam Vitals and nursing note reviewed.  Constitutional:      General: She is not in acute distress.    Appearance: She is not ill-appearing.  HENT:     Head: Normocephalic and atraumatic.     Right Ear: Tympanic membrane and external ear normal.     Left Ear: Tympanic membrane and external ear normal.     Nose: Nose normal.     Mouth/Throat:     Mouth: Mucous membranes are dry.     Pharynx: Oropharynx is clear.  Eyes:     General: No scleral icterus.       Right eye: No discharge.        Left eye: No discharge.     Extraocular Movements: Extraocular movements intact.     Conjunctiva/sclera: Conjunctivae normal.     Pupils: Pupils are equal, round, and reactive to light.  Neck:     Vascular: No JVD.  Cardiovascular:     Rate and Rhythm: Normal rate and regular rhythm.     Pulses: Normal pulses.          Radial pulses are 2+ on the right side and 2+ on the left side.     Heart sounds: Normal heart sounds.  Pulmonary:     Comments: Lungs clear to auscultation in all fields. Symmetric chest rise. No wheezing, rales, or rhonchi. Abdominal:     Tenderness: There is no right CVA tenderness or left CVA tenderness.     Comments: Abdomen is soft, non-distended.  There is tenderness to palpation of left upper and lower quadrants with voluntary guarding.  No rigidity. No  peritoneal signs.     Musculoskeletal:        General: Normal range of motion.     Cervical back: Normal range of motion.  Skin:    General: Skin is warm and dry.     Capillary Refill: Capillary refill takes less than 2 seconds.  Neurological:     Mental Status: She is oriented to person, place, and time.     GCS: GCS eye subscore is 4. GCS verbal subscore is 5. GCS motor subscore is 6.     Comments: Fluent speech, no facial  droop.  Psychiatric:        Behavior: Behavior normal.       ED Results / Procedures / Treatments   Labs (all labs ordered are listed, but only abnormal results are displayed) Labs Reviewed  COMPREHENSIVE METABOLIC PANEL - Abnormal; Notable for the following components:      Result Value   Glucose, Bld 120 (*)    All other components within normal limits  CBC - Abnormal; Notable for the following components:   RBC 5.77 (*)    Hemoglobin 16.1 (*)    HCT 49.8 (*)    All other components within normal limits  URINALYSIS, ROUTINE W REFLEX MICROSCOPIC - Abnormal; Notable for the following components:   Leukocytes,Ua LARGE (*)    Bacteria, UA RARE (*)    All other components within normal limits  URINE CULTURE  LIPASE, BLOOD  I-STAT BETA HCG BLOOD, ED ([facility], [facility], [facility] ONLY)    EKG None  Radiology CT ABDOMEN PELVIS W CONTRAST  Result Date: 08/09/2019 CLINICAL DATA:  Left lower quadrant abdominal pain and diarrhea. Nausea. EXAM: CT ABDOMEN AND PELVIS WITH CONTRAST TECHNIQUE: Multidetector CT imaging of the abdomen and pelvis was performed using the standard protocol following bolus administration of intravenous contrast. CONTRAST:  OMNIPAQUE IOHEXOL 300 MG/ML  SOLN COMPARISON:  None. FINDINGS: Lower chest: Unremarkable Hepatobiliary: Cholecystectomy. Otherwise unremarkable. Pancreas: Unremarkable Spleen: Unremarkable Adrenals/Urinary Tract: Unremarkable Stomach/Bowel: There is formed stool in the distal colon. Nondistended descending and proximal sigmoid  colon with borderline wall thickening. No dilated bowel. Normal appendix. Vascular/Lymphatic: Unremarkable Reproductive: Unremarkable Other: No supplemental non-categorized findings. Musculoskeletal: Incidental S1 spina bifida occulta. IMPRESSION: Borderline wall thickening in the descending and proximal sigmoid colon could reflect low-grade colitis. There is formed stool in the distal colon. No other specific cause for the patient's left lower quadrant abdominal pain is identified. Electronically Signed   By: Gaylyn Rong M.D.   On: 08/09/2019 18:48    Procedures Procedures (including critical care time)  Medications Ordered in ED Medications  sodium chloride flush (NS) 0.9 % injection 3 mL (has no administration in time range)  sodium chloride (PF) 0.9 % injection (has no administration in time range)  amoxicillin-clavulanate (AUGMENTIN) 875-125 MG per tablet 1 tablet (has no administration in time range)  ondansetron (ZOFRAN) injection 4 mg (4 mg Intravenous Given 08/09/19 1550)  sodium chloride 0.9 % bolus 1,000 mL (0 mLs Intravenous Stopped 08/09/19 1921)  morphine 4 MG/ML injection 4 mg (4 mg Intravenous Given 08/09/19 1709)  iohexol (OMNIPAQUE) 300 MG/ML solution 100 mL (100 mLs Intravenous Contrast Given 08/09/19 1805)    ED Course  I have reviewed the triage vital signs and the nursing notes.  Pertinent labs & imaging results that were available during my care of the patient were reviewed by me and considered in my medical decision making (see chart for details).    MDM Rules/Calculators/A&P                      Patient presents to the ED with complaints of abdominal pain. Patient nontoxic appearing, in no apparent distress, vitals WNL. On exam patient tender to left upper and lower quadrants, no peritoneal signs.  Does appear dehydrated with dry mucous membranes.  Will evaluate with labs.  Analgesics, anti-emetics, and fluids administered.   Labs reviewed and grossly  unremarkable. No leukocytosis, no anemia.  Patient does appear dehydrated with CBC hemoconcentrated. No significant electrolyte derangements. LFTs, renal function, and lipase WNL. Urinalysis  with large leukocytes, 6-10 WBC, rare bacteria, also with 6-10 squamous epithelial cells so question if sample is contaminated. Will send for urine culture.  Pregnancy test is negative. On reassessment patient is still having tenderness on the left upper and lower quadrants.  Will proceed with CT abdomen pelvis given exquisite tenderness despite attempting pain control.  Imaging shows possible colitis in descending proximal sigmoid colon. Findings and plan of care discussed with supervising physician Dr. Jacqulyn Bath who agrees with plan to treat antibiotics for possible bacterial etiology. On reassessment his pain has significantly improved and he is resting comfortably.  He is tolerating p.o. intake. No episodes of emesis here in the emergency department.  Will discharge home with supportive measures as well as antibiotics . I discussed results, treatment plan, need for PCP follow-up, and return precautions with the patient. Provided opportunity for questions, patient confirmed understanding and is in agreement with plan.  Ambulatory referral sent to GI and recommend patient follow-up which he appears reliable for.  Portions of this note were generated with Scientist, clinical (histocompatibility and immunogenetics). Dictation errors may occur despite best attempts at proofreading.   Final Clinical Impression(s) / ED Diagnoses Final diagnoses:  Abdominal pain, unspecified abdominal location    Rx / DC Orders ED Discharge Orders         Ordered    Ambulatory referral to Gastroenterology     08/09/19 1910    amoxicillin-clavulanate (AUGMENTIN) 875-125 MG tablet  Every 12 hours     08/09/19 1918    ondansetron (ZOFRAN) 4 MG tablet  Every 6 hours PRN     08/09/19 1918           Sherene Sires, PA-C 08/09/19 1928    Kathyrn Lass 08/09/19 Shanna Cisco, MD 08/09/19 2258

## 2019-08-09 NOTE — ED Triage Notes (Signed)
Patient here from home with complaints of nausea, vomiting 3 days ago. Reports neg covid test yesterday.

## 2019-08-09 NOTE — Discharge Instructions (Addendum)
You have been seen today for abdominal pain. Please read and follow all provided instructions. Return to the emergency room for worsening condition or new concerning symptoms.    CT scan today shows you possibly have colitis.  1. Medications:  -Prescription sent to your pharmacy for Augmentin.  This is an antibiotic.  Please take as prescribed. -Prescription also sent for Zofran.  This is used to treat nausea.  Please take as needed. -Recommend you take ibuprofen 600 mg as needed for pain.  Please take as directed on the bottle.  Continue usual home medications Take medications as prescribed. Please review all of the medicines and only take them if you do not have an allergy to them.   2. Treatment: rest, drink plenty of fluids  3. Follow Up: Please follow-up with the stomach doctors as we discussed.  I have given you the information for [facility].  I have also sent a referral to them so they should be contacting you.  If you do not hear from them in 1 week you can call the office to try to schedule an appointment.   It is also a possibility that you have an allergic reaction to any of the medicines that you have been prescribed - Everybody reacts differently to medications and while MOST people have no trouble with most medicines, you may have a reaction such as nausea, vomiting, rash, swelling, shortness of breath. If this is the case, please stop taking the medicine immediately and contact your physician.  ?

## 2019-08-10 ENCOUNTER — Encounter: Payer: Self-pay | Admitting: Physician Assistant

## 2019-08-10 LAB — URINE CULTURE

## 2019-08-12 ENCOUNTER — Ambulatory Visit: Payer: BC Managed Care – PPO | Attending: Family

## 2019-08-12 DIAGNOSIS — Z23 Encounter for immunization: Secondary | ICD-10-CM

## 2019-08-12 NOTE — Progress Notes (Signed)
   Covid-19 Vaccination Clinic  Name:  Jozelyn Kuwahara    MRN: [id number] DOB: 04-09-2001  08/12/2019  Ms. Wunder was observed post Covid-19 immunization for 15 minutes without incident. She was provided with Vaccine Information Sheet and instruction to access the V-Safe system.   Ms. Hillenburg was instructed to call 911 with any severe reactions post vaccine: Marland Kitchen Difficulty breathing  . Swelling of face and throat  . A fast heartbeat  . A bad rash all over body  . Dizziness and weakness   Immunizations Administered    Name Date Dose VIS Date Route   JANSSEN COVID-19 VACCINE 08/12/2019 12:06 PM 0.5 mL 07/08/2019 Intramuscular   Manufacturer: [employer]   Lot: [id number]   NDC: 08138-871-95

## 2019-08-24 ENCOUNTER — Ambulatory Visit: Payer: BC Managed Care – PPO | Admitting: Physician Assistant

## 2019-09-04 ENCOUNTER — Encounter: Payer: Self-pay | Admitting: *Deleted

## 2019-09-07 ENCOUNTER — Other Ambulatory Visit: Payer: BC Managed Care – PPO

## 2019-09-07 ENCOUNTER — Ambulatory Visit: Payer: BC Managed Care – PPO | Admitting: Physician Assistant

## 2019-09-07 ENCOUNTER — Encounter: Payer: Self-pay | Admitting: Physician Assistant

## 2019-09-07 DIAGNOSIS — R112 Nausea with vomiting, unspecified: Secondary | ICD-10-CM | POA: Diagnosis not present

## 2019-09-07 DIAGNOSIS — R197 Diarrhea, unspecified: Secondary | ICD-10-CM | POA: Diagnosis not present

## 2019-09-07 DIAGNOSIS — R109 Unspecified abdominal pain: Secondary | ICD-10-CM

## 2019-09-07 MED ORDER — OMEPRAZOLE 20 MG PO CPDR: DELAYED_RELEASE_CAPSULE | ORAL | 5 refills | Status: DC

## 2019-09-07 NOTE — Patient Instructions (Addendum)
If you are age 21 or older, your body mass index should be between 23-30. Your Body mass index is 33.89 kg/m. If this is out of the aforementioned range listed, please consider follow up with your Primary Care Provider.  If you are age 72 or younger, your body mass index should be between 19-25. Your Body mass index is 33.89 kg/m. If this is out of the aformentioned range listed, please consider follow up with your Primary Care Provider.   Your provider has requested that you go to the basement level for lab work before leaving today. Press "B" on the elevator. The lab is located at the first door on the left as you exit the elevator.  We have sent the following medications to your pharmacy for you to pick up at your convenience:  START:   Omeprazole 20mg  take one capsule 30-60 minutes before breakfast.  Thank you for entrusting me with your care and choosing [facility].  [facility], Hyacinth Meeker

## 2019-09-07 NOTE — Progress Notes (Signed)
Chief Complaint: Intermittent left lower quadrant abdominal pain  HPI: .   Mr, [name] is a 19 year old transgender female to female with past medical history significant for asthma, major depressive disorder and others, who was referred to me by Cynthia Sires, PA* for a complaint of abdominal pain.      08/09/2019 patient seen in the ER with progressively worsening nausea and vomiting as well as constant left-sided abdominal pain.  At that time CMP was normal.  CBC with hemoglobin of 16.1.  Urinalysis with large amount of leukocytes.  Lipase normal.  CTAP with contrast showed borderline wall thickening of the descending and proximal sigmoid colon which could reflect low-grade colitis.  There was formed stool in the distal colon.  Patient was given Augmentin twice daily and Zofran.    Today, the patient presents to clinic accompanied by a friend and explains that ever since being on the antibiotics the abdominal pain is decreased and is no longer constant, it was a 9-10/10 and is now a 3-4/10.  This has migrated to really "all over my stomach", but mostly lower.  Tells me that it "hurts now and then".  Explains that he has continued with loose stool that is "yellow and floats".  This is typically after eating anything.  Denies any solid bowel movements over the past month.  Describes a history of a cholecystectomy.  Denies any weight loss.    Also describes some nausea which is mostly there when laying down, occasionally will vomit, this is worse at night.  Tells me that previously was drinking coffee which seemed to increase stomach pain at the top, which was associated with this nausea, so he discontinued drinking coffee.    Also tells me he has an upcoming elbow surgery in the beginning of May.  Describes that his PCP was waiting to start him on ADHD meds because they did not want to worsen his abdominal pain.    Denies fever, chills, weight loss or symptoms that awaken him from sleep.      Past  Medical History:  Diagnosis Date  . Anxiety   . Asthma   . B12 deficiency   . Gallbladder sludge   . Post concussion syndrome   . Severe depression (HCC)     Past Surgical History:  Procedure Laterality Date  . brain cyst removal    . CHOLECYSTECTOMY    . LIGAMENT REPAIR Left    radial  . thumb surgery    . ULNAR NERVE REPAIR    . WISDOM TOOTH EXTRACTION      Current Outpatient Medications  Medication Sig Dispense Refill  . albuterol (PROVENTIL HFA;VENTOLIN HFA) 108 (90 Base) MCG/ACT inhaler Inhale 2 puffs into the lungs every 6 (six) hours as needed for wheezing or shortness of breath.    . bismuth subsalicylate (PEPTO BISMOL) 262 MG/15ML suspension Take 15 mLs by mouth 2 (two) times daily as needed for diarrhea or loose stools.    Marland Kitchen ibuprofen (ADVIL,MOTRIN) 800 MG tablet Take 1 tablet (800 mg total) by mouth 3 (three) times daily. (Patient taking differently: Take 800 mg by mouth daily as needed. ) 21 tablet 0  . loratadine (CLARITIN) 10 MG tablet Take 1 tablet (10 mg total) by mouth daily. (May buy from over the counter): For allergies (Patient taking differently: Take 10 mg by mouth daily. )    . Melatonin CR 3 MG TBCR Take 3 mg by mouth daily as needed (for sleep).     Marland Kitchen  ondansetron (ZOFRAN) 4 MG tablet Take 1 tablet (4 mg total) by mouth every 6 (six) hours as needed for nausea or vomiting. 12 tablet 0  . testosterone cypionate (DEPOTESTOSTERONE CYPIONATE) 200 MG/ML injection Inject 0.375 mLs into the muscle once a week.     . traZODone (DESYREL) 50 MG tablet Take 1 tablet (50 mg total) by mouth at bedtime as needed for sleep. 30 tablet 0   No current facility-administered medications for this visit.    Allergies as of 09/07/2019 - Review Complete 09/07/2019  Allergen Reaction Noted  . Bee pollen Anaphylaxis 07/20/2018  . Mold extract [trichophyton] Anaphylaxis and Itching 03/14/2017  . Pollen extract Anaphylaxis 07/20/2018  . Zyrtec [cetirizine] Anaphylaxis 03/14/2017   . Mixed ragweed Itching 09/07/2019    Family History  Problem Relation Age of Onset  . Seizures Maternal Aunt   . Heart disease Paternal Aunt   . Diabetes Maternal Grandmother   . Heart disease Maternal Grandmother   . Heart disease Maternal Grandfather   . Deep vein thrombosis Maternal Grandfather   . Hyperlipidemia Maternal Grandfather   . Stomach cancer Paternal Grandfather   . Colon cancer Neg Hx   . Esophageal cancer Neg Hx   . Pancreatic cancer Neg Hx     Social History   Socioeconomic History  . Marital status: Single    Spouse name: Not on file  . Number of children: Not on file  . Years of education: Not on file  . Highest education level: Not on file  Occupational History  . Not on file  Tobacco Use  . Smoking status: Never Smoker  . Smokeless tobacco: Never Used  Substance and Sexual Activity  . Alcohol use: Yes    Comment: occasionally  . Drug use: Yes    Types: Marijuana  . Sexual activity: Not Currently  Other Topics Concern  . Not on file  Social History Narrative  . Not on file   Social Determinants of Health   Financial Resource Strain:   . Difficulty of Paying Living Expenses:   Food Insecurity:   . Worried About Charity fundraiser in the Last Year:   . Arboriculturist in the Last Year:   Transportation Needs:   . Film/video editor (Medical):   Marland Kitchen Lack of Transportation (Non-Medical):   Physical Activity:   . Days of Exercise per Week:   . Minutes of Exercise per Session:   Stress:   . Feeling of Stress :   Social Connections:   . Frequency of Communication with Friends and Family:   . Frequency of Social Gatherings with Friends and Family:   . Attends Religious Services:   . Active Member of Clubs or Organizations:   . Attends Archivist Meetings:   Marland Kitchen Marital Status:   Intimate Partner Violence:   . Fear of Current or Ex-Partner:   . Emotionally Abused:   Marland Kitchen Physically Abused:   . Sexually Abused:     Review of  Systems:    Constitutional: No weight loss, fever or chills Skin: No rash  Cardiovascular: No chest pain Respiratory: No SOB Gastrointestinal: See HPI and otherwise negative Genitourinary: No dysuria  Neurological: No headache, dizziness or syncope Musculoskeletal: No new muscle or joint pain Hematologic: No bleeding  Psychiatric: No history of depression or anxiety   Physical Exam:  Vital signs: BP 112/80 (BP Location: Left Arm, Patient Position: Sitting, Cuff Size: Normal)   Pulse 74   Temp (!) 97.2  F (36.2 C)   Ht 5\' 9"  (1.753 m)   Wt 229 lb 8 oz (104.1 kg)   SpO2 98%   BMI 33.89 kg/m    Constitutional:   Pleasant female appears to be in NAD, Well developed, Well nourished, alert and cooperative Head:  Normocephalic and atraumatic. Eyes:   PEERL, EOMI. No icterus. Conjunctiva pink. Ears:  Normal auditory acuity. Neck:  Supple Throat: Oral cavity and pharynx without inflammation, swelling or lesion.  Respiratory: Respirations even and unlabored. Lungs clear to auscultation bilaterally.   No wheezes, crackles, or rhonchi.  Cardiovascular: Normal S1, S2. No MRG. Regular rate and rhythm. No peripheral edema, cyanosis or pallor.  Gastrointestinal:  Soft, nondistended, moderate b/l lower abdominal ttp. No rebound or guarding. Normal bowel sounds. No appreciable masses or hepatomegaly. Rectal:  Not performed.  Msk:  Symmetrical without gross deformities. Without edema, no deformity or joint abnormality.  Neurologic:  Alert and  oriented x4;  grossly normal neurologically.  Skin:   Dry and intact without significant lesions or rashes. Psychiatric: Demonstrates good judgement and reason without abnormal affect or behaviors.  RELEVANT LABS AND IMAGING: CBC    Component Value Date/Time   WBC 5.3 08/09/2019 1455   RBC 5.77 (H) 08/09/2019 1455   HGB 16.1 (H) 08/09/2019 1455   HCT 49.8 (H) 08/09/2019 1455   PLT 196 08/09/2019 1455   MCV 86.3 08/09/2019 1455   MCH 27.9 08/09/2019  1455   MCHC 32.3 08/09/2019 1455   RDW 12.8 08/09/2019 1455    CMP     Component Value Date/Time   NA 139 08/09/2019 1455   K 3.9 08/09/2019 1455   CL 108 08/09/2019 1455   CO2 24 08/09/2019 1455   GLUCOSE 120 (H) 08/09/2019 1455   BUN 10 08/09/2019 1455   CREATININE 0.93 08/09/2019 1455   CALCIUM 9.0 08/09/2019 1455   PROT 7.3 08/09/2019 1455   ALBUMIN 4.2 08/09/2019 1455   AST 23 08/09/2019 1455   ALT 33 08/09/2019 1455   ALKPHOS 50 08/09/2019 1455   BILITOT 0.6 08/09/2019 1455   GFRNONAA >60 08/09/2019 1455   GFRAA >60 08/09/2019 1455    Assessment: 1.  Abdominal pain: CT in the ER showing borderline wall thickening of the descending and sigmoid colon, pain improved after Augmentin twice daily x10 days, but does continue; consider IBD versus other 2.  Diarrhea: Loose stools at least 3-4 times a day, typically after eating now 3.  Nausea and vomiting: Occasionally when laying down at night, sometimes associated with epigastric pain; consider gastritis versus other 4.  Abnormal CT abdomen: As above  Plan: 1.  Ordered stool studies.  If these are negative for infectious work-up then recommend the patient have a colonoscopy and this will be set up in the [facility] with Dr. 08/11/2019.  Did discuss risks,  benefits, limitations and alternatives and the patient agrees to proceed.  Would also wait at least a week out from elbow surgery which is scheduled the beginning of May. 2.  Prescribed Omeprazole 20 mg daily, 30-60 minutes before breakfast for occasional nausea and vomiting with some epigastric pain 3.  Patient to follow in clinic per recommendations after stool studies/procedure.  June, PA-C [facility] 09/07/2019, 11:50 AM  Cc: 09/09/2019, PA*

## 2019-09-07 NOTE — Progress Notes (Signed)
Noted  

## 2019-09-11 HISTORY — PX: ULNAR NERVE REPAIR: SHX2594

## 2019-09-11 LAB — GI PROFILE, STOOL, PCR

## 2019-09-12 ENCOUNTER — Other Ambulatory Visit: Payer: Self-pay

## 2019-09-12 MED ORDER — VANCOMYCIN HCL 125 MG PO CAPS: 125.0000 mg | ORAL_CAPSULE | Freq: Four times a day (QID) | ORAL | 0 refills | Status: AC

## 2019-09-19 ENCOUNTER — Telehealth: Payer: Self-pay | Admitting: Physician Assistant

## 2019-09-19 NOTE — Telephone Encounter (Signed)
Patient calling with questions on his previous visit and medications

## 2019-09-19 NOTE — Telephone Encounter (Signed)
The pt states that he drank coffee this morning and that caused abd pain. He is on vanc for C diff and has several days of medications left.  He will finish the abx and avoid caffeine and keep appt with Victorino Dike on 6/4. He will call back if things worsen

## 2019-10-03 ENCOUNTER — Telehealth (HOSPITAL_COMMUNITY): Payer: Self-pay

## 2019-10-03 NOTE — Telephone Encounter (Signed)
[name] the pt states he is doing some better.  He is aware his C Diff is back and Dr Clelia Croft prescribed vancomycin.

## 2019-10-03 NOTE — Telephone Encounter (Signed)
-----   Message from Jennifer Lynne Lemmon, PA sent at 10/03/2019  3:31 PM EDT ----- Regarding: FW: C-diff Can you call patient and see how he is doing. Thanks-JLL ----- Message ----- From: Vernon, Heather L, CMA Sent: 10/03/2019   1:55 PM EDT To: Jennifer Lynne Lemmon, PA Subject: C-diff                                         I received a fax from Dr. Eva Shaw on the above patient from a stool study that was collected on 5/20 that is still positive for C-diff. Just thought you should know.   

## 2019-10-03 NOTE — Telephone Encounter (Signed)
-----   Message from Unk Lightning, [place] sent at 10/03/2019  3:31 PM EDT ----- Regarding: FW: C-diff Can you call patient and see how he is doing. Thanks-JLL ----- Message ----- From: Mariane Duval, CMA Sent: 10/03/2019   1:55 PM EDT To: Unk Lightning, PA Subject: C-diff                                         I received a fax from Dr. Norberto Sorenson on the above patient from a stool study that was collected on 5/20 that is still positive for C-diff. Just thought you should know.

## 2019-10-06 ENCOUNTER — Encounter: Payer: Self-pay | Admitting: *Deleted

## 2019-10-13 ENCOUNTER — Encounter: Payer: Self-pay | Admitting: Physician Assistant

## 2019-10-13 ENCOUNTER — Other Ambulatory Visit (INDEPENDENT_AMBULATORY_CARE_PROVIDER_SITE_OTHER): Payer: BC Managed Care – PPO

## 2019-10-13 ENCOUNTER — Ambulatory Visit: Payer: BC Managed Care – PPO | Admitting: Physician Assistant

## 2019-10-13 VITALS — BP 100/78 | HR 68 | Ht 69.0 in | Wt 230.1 lb

## 2019-10-13 DIAGNOSIS — R1084 Generalized abdominal pain: Secondary | ICD-10-CM

## 2019-10-13 DIAGNOSIS — A0472 Enterocolitis due to Clostridium difficile, not specified as recurrent: Secondary | ICD-10-CM

## 2019-10-13 DIAGNOSIS — R112 Nausea with vomiting, unspecified: Secondary | ICD-10-CM

## 2019-10-13 LAB — CBC WITH DIFFERENTIAL/PLATELET
Basophils Absolute: 0.1 10*3/uL (ref 0.0–0.1)
Basophils Relative: 0.9 % (ref 0.0–3.0)
Eosinophils Absolute: 0.2 10*3/uL (ref 0.0–0.7)
Eosinophils Relative: 2.7 % (ref 0.0–5.0)
HCT: 45.5 % (ref 36.0–46.0)
Hemoglobin: 15.8 g/dL — ABNORMAL HIGH (ref 12.0–15.0)
Lymphocytes Relative: 38.2 % (ref 12.0–46.0)
Lymphs Abs: 2.8 10*3/uL (ref 0.7–4.0)
MCHC: 34.7 g/dL (ref 30.0–36.0)
MCV: 84.4 fl (ref 78.0–100.0)
Monocytes Absolute: 0.6 10*3/uL (ref 0.1–1.0)
Monocytes Relative: 8.2 % (ref 3.0–12.0)
Neutro Abs: 3.6 10*3/uL (ref 1.4–7.7)
Neutrophils Relative %: 50 % (ref 43.0–77.0)
Platelets: 176 10*3/uL (ref 150.0–400.0)
RBC: 5.39 Mil/uL — ABNORMAL HIGH (ref 3.87–5.11)
RDW: 13.6 % (ref 11.5–15.5)
WBC: 7.3 10*3/uL (ref 4.0–10.5)

## 2019-10-13 LAB — COMPREHENSIVE METABOLIC PANEL
ALT: 15 U/L (ref 0–35)
AST: 15 U/L (ref 0–37)
Albumin: 4.4 g/dL (ref 3.5–5.2)
Alkaline Phosphatase: 53 U/L (ref 39–117)
BUN: 10 mg/dL (ref 6–23)
CO2: 28 mEq/L (ref 19–32)
Calcium: 9.1 mg/dL (ref 8.4–10.5)
Chloride: 104 mEq/L (ref 96–112)
Creatinine, Ser: 0.96 mg/dL (ref 0.40–1.20)
GFR: 88.66 mL/min (ref >60.00)
Glucose, Bld: 101 mg/dL — ABNORMAL HIGH (ref 70–99)
Potassium: 3.8 mEq/L (ref 3.5–5.1)
Sodium: 139 mEq/L (ref 135–145)
Total Bilirubin: 0.6 mg/dL (ref 0.2–1.2)
Total Protein: 7.1 g/dL (ref 6.0–8.3)

## 2019-10-13 MED ORDER — DIFICID 200 MG PO TABS: 200.0000 mg | ORAL_TABLET | Freq: Two times a day (BID) | ORAL | 0 refills | Status: DC

## 2019-10-13 MED ORDER — ONDANSETRON HCL 4 MG PO TABS: 8.0000 mg | ORAL_TABLET | Freq: Three times a day (TID) | ORAL | 2 refills | Status: DC | PRN

## 2019-10-13 NOTE — Patient Instructions (Addendum)
You have been scheduled for a CT scan of the abdomen and pelvis at [facility] ([address]---this is in the same building as [employer]).   You are scheduled on 10/16/19 at 3:00 pm. You should arrive 15 minutes prior to your appointment time for registration. Please follow the written instructions below on the day of your exam:  WARNING: IF YOU ARE ALLERGIC TO IODINE/X-RAY DYE, PLEASE NOTIFY RADIOLOGY IMMEDIATELY AT [phone]! YOU WILL BE GIVEN A 13 HOUR PREMEDICATION PREP.  1) Do not eat or drink anything after 11:00 am (4 hours prior to your test) 2) You have been given 2 bottles of oral contrast to drink. The solution may taste better if refrigerated, but do NOT add ice or any other liquid to this solution. Shake well before drinking.    Drink 1 bottle of contrast @ 1:00 pm (2 hours prior to your exam)  Drink 1 bottle of contrast @ 2:00 pm (1 hour prior to your exam)  You may take any medications as prescribed with a small amount of water, if necessary. If you take any of the following medications: METFORMIN, GLUCOPHAGE, GLUCOVANCE, AVANDAMET, RIOMET, FORTAMET, [place] MET, JANUMET, GLUMETZA or METAGLIP, you MAY be asked to HOLD this medication 48 hours AFTER the exam.  The purpose of you drinking the oral contrast is to aid in the visualization of your intestinal tract. The contrast solution may cause some diarrhea. Depending on your individual set of symptoms, you may also receive an intravenous injection of x-ray contrast/dye. Plan on being at [facility] for 30 minutes or longer, depending on the type of exam you are having performed.  This test typically takes 30-45 minutes to complete.  If you have any questions regarding your exam or if you need to reschedule, you may call the CT department at [phone] between the hours of 8:00 am and 5:00 pm, Monday-Friday.  _______________________________________________________________  Your provider has  requested that you go to the basement level for lab work before leaving today. Press "B" on the elevator. The lab is located at the first door on the left as you exit the elevator. ______________________________________________________________  We have sent the following medications to your pharmacy for you to pick up at your convenience: Dificid 200 mg twice daily x 10 days Zofran 4 mg-2 tablets by mouth every 8 hours as needed for nausea/vomiting _______________________________________________________ Please follow up with Ellouise Newer, PA-C in 1 month. _________________________________________________________ If you are age 64 or older, your body mass index should be between 23-30. Your Body mass index is 33.98 kg/m. If this is out of the aforementioned range listed, please consider follow up with your Primary Care Provider.  If you are age 59 or younger, your body mass index should be between 19-25. Your Body mass index is 33.98 kg/m. If this is out of the aformentioned range listed, please consider follow up with your Primary Care Provider.  _______________________________________________________ Due to recent changes in healthcare laws, you may see the results of your imaging and laboratory studies on MyChart before your provider has had a chance to review them.  We understand that in some cases there may be results that are confusing or concerning to you. Not all laboratory results come back in the same time frame and the provider may be waiting for multiple results in order to interpret others.  Please give [place] 48 hours in order for your provider to thoroughly review all the results before contacting the office for clarification  of your results.

## 2019-10-13 NOTE — Progress Notes (Signed)
Assessment and plan reviewed with GI PA

## 2019-10-13 NOTE — Progress Notes (Signed)
Chief Complaint: Follow-up diarrhea, nausea, vomiting and abdominal pain  HPI:    [name] is a 19 year old transgender female to female, who was assigned to Dr. Henrene Bruce at last visit and returns to clinic today for follow-up of diarrhea, nausea, vomiting abdominal pain.    09/07/2019 patient was seen in clinic for abdominal pain.  At that time and had a recent CTAP with contrast showing borderline wall thickening of the descending and proximal sigmoid colon.  He had been given Augmentin twice daily from the ER.  At that time continued to complain of abdominal pain that migrated all over with loose stool.  Also nausea worse with laying down.  At that time ordered stool studies.  Prescribed omeprazole 20 mg daily.    09/07/2019 patient was positive for C. difficile toxin a and B by PCR.  Prescribed vancomycin 125 mg p.o. every 6 hours x10 days.    09/28/2019 patient had repeat stool study that was still positive for C. difficile by Dr. Brigitte Bruce his PCP.  He was given another round of vancomycin.  At that time he was called by our clinic for follow-up and was said that he was doing some better.    Today, the patient returns to clinic accompanied by his friend and tells me that after finishing the antibiotic initially he had continued with abdominal pain and nausea and vomiting and loose stool.  He had a primary care visit 3 days afterwards and complained of his stomach still hurting and was given a second round of Vancomycin but after 4 days he felt even worse and was unable to keep anything down at all with increased vomiting and generalized abdominal pain rated as an 8-9/10.  He stopped this medication and his PCP sent in Flagyl for him which he has still not started.  He has not been on antibiotics over the past week and a half.  Describes that all he is able to take in his a protein shake and a multivitamin but denies any weight loss.  Tells me that if he tries to eat anything solid he will have loose stool  within 10 minutes and then continue to have stool every 5 to 10 minutes and is constantly nauseous and will vomit anytime he puts anything solid in his mouth.  Also continues with generalized abdominal pain rated as an 8-9/10 which is so much that he cannot sleep at night.  Along with this has become dizzy and lightheaded over the past few days.    Denies fever, chills or blood in his stool.  Past Medical History:  Diagnosis Date  . Anxiety   . Asthma   . B12 deficiency   . C. difficile diarrhea   . Gallbladder sludge   . Post concussion syndrome   . Severe depression ([place])     Past Surgical History:  Procedure Laterality Date  . brain cyst removal    . CHOLECYSTECTOMY    . LIGAMENT REPAIR Left    radial  . thumb surgery    . ULNAR NERVE REPAIR Left 09/11/2019  . WISDOM TOOTH EXTRACTION      Current Outpatient Medications  Medication Sig Dispense Refill  . albuterol (PROVENTIL HFA;VENTOLIN HFA) 108 (90 Base) MCG/ACT inhaler Inhale 2 puffs into the lungs every 6 (six) hours as needed for wheezing or shortness of breath.    . amphetamine-dextroamphetamine (ADDERALL XR) 15 MG 24 hr capsule Take 15 mg by mouth every morning.    . cyanocobalamin 1000 MCG  tablet Take 1 tablet by mouth daily.    . ergocalciferol (VITAMIN D2) 1.25 MG (50000 UT) capsule Take 50,000 Units by mouth once a week.    . loratadine (CLARITIN) 10 MG tablet Take 1 tablet (10 mg total) by mouth daily. (May buy from over the counter): For allergies (Patient taking differently: Take 10 mg by mouth daily. )    . omeprazole (PRILOSEC) 20 MG capsule Take one capsule 20-30 minutes prior to breakfast. 30 capsule 5  . Rimegepant Sulfate (NURTEC) 75 MG TBDP Take 1 tablet by mouth daily.    . rizatriptan (MAXALT) 10 MG tablet Take 1 tablet by mouth as needed.    . testosterone cypionate (DEPOTESTOSTERONE CYPIONATE) 200 MG/ML injection Inject 7.75 mLs into the muscle every 14 (fourteen) days.     . fidaxomicin (DIFICID) 200 MG  TABS tablet Take 1 tablet (200 mg total) by mouth 2 (two) times daily. 20 tablet 0  . metroNIDAZOLE (FLAGYL) 500 MG tablet Take 500 mg by mouth 3 (three) times daily.    . ondansetron (ZOFRAN) 4 MG tablet Take 2 tablets (8 mg total) by mouth every 8 (eight) hours as needed for nausea or vomiting. 30 tablet 2   No current facility-administered medications for this visit.    Allergies as of 10/13/2019 - Review Complete 10/13/2019  Allergen Reaction Noted  . Bee pollen Anaphylaxis 07/20/2018  . Mold extract [trichophyton] Anaphylaxis and Itching 03/14/2017  . Pollen extract Anaphylaxis 07/20/2018  . Zyrtec [cetirizine] Anaphylaxis 03/14/2017  . Mixed ragweed Itching 09/07/2019    Family History  Problem Relation Age of Onset  . Seizures Maternal Aunt   . Heart disease Paternal Aunt   . Diabetes Maternal Grandmother   . Heart disease Maternal Grandmother   . Heart disease Maternal Grandfather   . Deep vein thrombosis Maternal Grandfather   . Hyperlipidemia Maternal Grandfather   . Stomach cancer Paternal Grandfather   . Colon cancer Neg Hx   . Esophageal cancer Neg Hx   . Pancreatic cancer Neg Hx     Social History   Socioeconomic History  . Marital status: Single    Spouse name: Not on file  . Number of children: Not on file  . Years of education: Not on file  . Highest education level: Not on file  Occupational History  . Not on file  Tobacco Use  . Smoking status: Never Smoker  . Smokeless tobacco: Never Used  Substance and Sexual Activity  . Alcohol use: Yes    Comment: occasionally  . Drug use: Yes    Types: Marijuana  . Sexual activity: Not Currently  Other Topics Concern  . Not on file  Social History Narrative  . Not on file   Social Determinants of Health   Financial Resource Strain:   . Difficulty of Paying Living Expenses:   Food Insecurity:   . Worried About Programme researcher, broadcasting/film/video in the Last Year:   . Barista in the Last Year:     Transportation Needs:   . Freight forwarder (Medical):   Marland Kitchen Lack of Transportation (Non-Medical):   Physical Activity:   . Days of Exercise per Week:   . Minutes of Exercise per Session:   Stress:   . Feeling of Stress :   Social Connections:   . Frequency of Communication with Friends and Family:   . Frequency of Social Gatherings with Friends and Family:   . Attends Religious Services:   . Active  Member of Clubs or Organizations:   . Attends Banker Meetings:   Marland Kitchen Marital Status:   Intimate Partner Violence:   . Fear of Current or Ex-Partner:   . Emotionally Abused:   Marland Kitchen Physically Abused:   . Sexually Abused:     Review of Systems:    Constitutional: No weight loss, fever or chills Cardiovascular: No chest pain Respiratory: No SOB  Gastrointestinal: See HPI and otherwise negative   Physical Exam:  Vital signs: BP 100/78 (BP Location: Right Arm, Patient Position: Sitting, Cuff Size: Normal)   Bruce 66   Ht 5\' 9"  (1.753 m) Comment: height measured without shoes  Wt 230 lb 2 oz (104.4 kg)   BMI 33.98 kg/m   Constitutional:   Pleasant transgender female appears to be in NAD, Well developed, Well nourished, alert and cooperative  Respiratory: Respirations even and unlabored. Lungs clear to auscultation bilaterally.   No wheezes, crackles, or rhonchi.  Cardiovascular: Normal S1, S2. No MRG. Regular rate and rhythm. No peripheral edema, cyanosis or pallor.  Gastrointestinal:  Soft, nondistended, marked ttp to only light palpation, No rebound or guarding. Normal bowel sounds. No appreciable masses or hepatomegaly. Rectal:  Not performed.  Psychiatric: Oriented to person, place and time. Demonstrates good judgement and reason without abnormal affect or behaviors.  RELEVANT LABS AND IMAGING: CBC    Component Value Date/Time   WBC 5.3 08/09/2019 1455   RBC 5.77 (H) 08/09/2019 1455   HGB 16.1 (H) 08/09/2019 1455   HCT 49.8 (H) 08/09/2019 1455   PLT 196  08/09/2019 1455   MCV 86.3 08/09/2019 1455   MCH 27.9 08/09/2019 1455   MCHC 32.3 08/09/2019 1455   RDW 12.8 08/09/2019 1455    CMP     Component Value Date/Time   NA 139 08/09/2019 1455   K 3.9 08/09/2019 1455   CL 108 08/09/2019 1455   CO2 24 08/09/2019 1455   GLUCOSE 120 (H) 08/09/2019 1455   BUN 10 08/09/2019 1455   CREATININE 0.93 08/09/2019 1455   CALCIUM 9.0 08/09/2019 1455   PROT 7.3 08/09/2019 1455   ALBUMIN 4.2 08/09/2019 1455   AST 23 08/09/2019 1455   ALT 33 08/09/2019 1455   ALKPHOS 50 08/09/2019 1455   BILITOT 0.6 08/09/2019 1455   GFRNONAA >60 08/09/2019 1455   GFRAA >60 08/09/2019 1455    Assessment: 1.  Diarrhea: C. difficile positive, treated with 1 round of vancomycin, symptoms persisted, CT in the ER initially showing some colitis 2.  Abdominal pain: With above 3.  Nausea and vomiting: With above  Plan: 1.  Repeat CBC and CMP today.  Ordered a CT abdomen pelvis with contrast for further evaluation. 2.  Prescribed Dificid 200 mg p.o. twice daily x10 days.  Advised the patient not to take the metronidazole. 3.  Prescribed Zofran 4 mg.  Instructed the patient take 1-2 tabs every 8 hour scheduled.  #30 with 2 refills. 4.  Encouraged the patient to continue drinking protein shakes and taking a multivitamin if this is all he can handle.  If dizziness/lightheadedness or other symptoms worsen he may need to go to the ER. 5.  Pending labs and studies above may need to consider endoscopic work-up. 6.  Did discuss case with Dr. 08/11/2019 at time of patient's appointment.  Patient will follow in clinic with me in 4-6 weeks or sooner if necessary.  Instructed him to call me know how he is doing in the next week if things are not  any better.  Hyacinth Meeker, PA-C [facility] 10/13/2019, 9:46 AM  Cc: Sherren Mocha, MD

## 2019-10-16 ENCOUNTER — Inpatient Hospital Stay: Admission: RE | Admit: 2019-10-16 | Payer: BC Managed Care – PPO | Source: Ambulatory Visit

## 2019-10-17 ENCOUNTER — Telehealth: Payer: Self-pay | Admitting: *Deleted

## 2019-10-17 ENCOUNTER — Inpatient Hospital Stay: Admission: RE | Admit: 2019-10-17 | Payer: BC Managed Care – PPO | Source: Ambulatory Visit

## 2019-10-17 NOTE — Telephone Encounter (Signed)
Received a call from [facility] that patient was scheduled for CT abd/pelvis on 10/16/19 that she did not come to. She called to rescheduled to today, 10/17/19. However, she has now no called and no showed to this appointment as well. They just wanted the provider to be made aware.

## 2019-10-25 ENCOUNTER — Emergency Department (HOSPITAL_COMMUNITY): Payer: BC Managed Care – PPO

## 2019-10-25 ENCOUNTER — Other Ambulatory Visit: Payer: Self-pay

## 2019-10-25 ENCOUNTER — Encounter (HOSPITAL_COMMUNITY): Payer: Self-pay

## 2019-10-25 ENCOUNTER — Telehealth: Payer: Self-pay | Admitting: Physician Assistant

## 2019-10-25 ENCOUNTER — Emergency Department (HOSPITAL_COMMUNITY)
Admission: EM | Admit: 2019-10-25 | Discharge: 2019-10-26 | Disposition: A | Discharge status: Home / Self Care | Payer: BC Managed Care – PPO | Attending: Emergency Medicine | Admitting: Emergency Medicine

## 2019-10-25 DIAGNOSIS — R197 Diarrhea, unspecified: Secondary | ICD-10-CM | POA: Diagnosis not present

## 2019-10-25 DIAGNOSIS — R1031 Right lower quadrant pain: Secondary | ICD-10-CM | POA: Diagnosis not present

## 2019-10-25 DIAGNOSIS — J45909 Unspecified asthma, uncomplicated: Secondary | ICD-10-CM | POA: Insufficient documentation

## 2019-10-25 DIAGNOSIS — R112 Nausea with vomiting, unspecified: Secondary | ICD-10-CM | POA: Diagnosis not present

## 2019-10-25 LAB — COMPREHENSIVE METABOLIC PANEL
ALT: 18 U/L (ref 0–44)
AST: 21 U/L (ref 15–41)
Albumin: 4.5 g/dL (ref 3.5–5.0)
Alkaline Phosphatase: 49 U/L (ref 38–126)
Anion gap: 12 (ref 5–15)
BUN: 12 mg/dL (ref 6–20)
CO2: 22 mmol/L (ref 22–32)
Calcium: 8.7 mg/dL — ABNORMAL LOW (ref 8.9–10.3)
Chloride: 106 mmol/L (ref 98–111)
Creatinine, Ser: 1.14 mg/dL — ABNORMAL HIGH (ref 0.44–1.00)
GFR calc Af Amer: >60 mL/min (ref >60)
GFR calc non Af Amer: >60 mL/min (ref >60)
Glucose, Bld: 109 mg/dL — ABNORMAL HIGH (ref 70–99)
Potassium: 3.7 mmol/L (ref 3.5–5.1)
Sodium: 140 mmol/L (ref 135–145)
Total Bilirubin: 1 mg/dL (ref 0.3–1.2)
Total Protein: 7.3 g/dL (ref 6.5–8.1)

## 2019-10-25 LAB — CBC
HCT: 48.2 % — ABNORMAL HIGH (ref 36.0–46.0)
Hemoglobin: 16.2 g/dL — ABNORMAL HIGH (ref 12.0–15.0)
MCH: 28.5 pg (ref 26.0–34.0)
MCHC: 33.6 g/dL (ref 30.0–36.0)
MCV: 84.7 fL (ref 80.0–100.0)
Platelets: 208 10*3/uL (ref 150–400)
RBC: 5.69 MIL/uL — ABNORMAL HIGH (ref 3.87–5.11)
RDW: 12.8 % (ref 11.5–15.5)
WBC: 6.2 10*3/uL (ref 4.0–10.5)
nRBC: 0 % (ref 0.0–0.2)

## 2019-10-25 LAB — URINALYSIS, ROUTINE W REFLEX MICROSCOPIC
Bilirubin Urine: NEGATIVE
Glucose, UA: NEGATIVE mg/dL
Hgb urine dipstick: NEGATIVE
Ketones, ur: 5 mg/dL — AB
Nitrite: NEGATIVE
Protein, ur: NEGATIVE mg/dL
Specific Gravity, Urine: >1.046 — ABNORMAL HIGH (ref 1.005–1.030)
pH: 6 (ref 5.0–8.0)

## 2019-10-25 LAB — LIPASE, BLOOD: Lipase: 32 U/L (ref 11–51)

## 2019-10-25 LAB — I-STAT BETA HCG BLOOD, ED (MC, WL, AP ONLY): I-stat hCG, quantitative: <5 m[IU]/mL (ref <5)

## 2019-10-25 MED ORDER — SODIUM CHLORIDE 0.9 % IV BOLUS
1000.0000 mL | Freq: Once | INTRAVENOUS | Status: AC
  Administered 2019-10-25: 1000 mL via INTRAVENOUS

## 2019-10-25 MED ORDER — IOHEXOL 300 MG/ML  SOLN
100.0000 mL | Freq: Once | INTRAMUSCULAR | Status: AC | PRN
  Administered 2019-10-25: 100 mL via INTRAVENOUS

## 2019-10-25 MED ORDER — MORPHINE SULFATE (PF) 4 MG/ML IV SOLN: 4.0000 mg | Freq: Once | INTRAVENOUS | Status: DC

## 2019-10-25 MED ORDER — ONDANSETRON 4 MG PO TBDP: 4.0000 mg | ORAL_TABLET | Freq: Three times a day (TID) | ORAL | 0 refills | Status: DC | PRN

## 2019-10-25 MED ORDER — ACETAMINOPHEN ER 650 MG PO TBCR
650.0000 mg | EXTENDED_RELEASE_TABLET | Freq: Three times a day (TID) | ORAL | 0 refills | Status: DC | PRN
Start: 2019-10-25 — End: 2019-12-01

## 2019-10-25 MED ORDER — SODIUM CHLORIDE 0.9% FLUSH: 3.0000 mL | Freq: Once | INTRAVENOUS | Status: DC

## 2019-10-25 MED ORDER — HYDROMORPHONE HCL 1 MG/ML IJ SOLN
1.0000 mg | Freq: Once | INTRAMUSCULAR | Status: AC
  Administered 2019-10-25: 1 mg via INTRAVENOUS
  Filled 2019-10-25: qty 1

## 2019-10-25 MED ORDER — ONDANSETRON HCL 4 MG/2ML IJ SOLN
4.0000 mg | Freq: Once | INTRAMUSCULAR | Status: AC
  Administered 2019-10-25: 4 mg via INTRAVENOUS
  Filled 2019-10-25: qty 2

## 2019-10-25 MED ORDER — MORPHINE SULFATE (PF) 4 MG/ML IV SOLN
4.0000 mg | Freq: Once | INTRAVENOUS | Status: AC
  Administered 2019-10-25: 4 mg via INTRAVENOUS
  Filled 2019-10-25: qty 1

## 2019-10-25 MED ORDER — SODIUM CHLORIDE (PF) 0.9 % IJ SOLN
INTRAMUSCULAR | Status: AC
  Filled 2019-10-25: qty 50

## 2019-10-25 NOTE — ED Provider Notes (Signed)
[facility] DEPT Provider Note   CSN: [id number] Arrival date & time: 10/25/19  1750     History Chief Complaint  Patient presents with  . Abdominal Pain    [name] is a 19 y.o. adult with a past medical history significant for anxiety, asthma, B12 deficiency, and depression who presents to the ED due to right lower quadrant abdominal pain that started earlier this morning.  Patient describes pain as a sharp sensation that has worsened throughout the day.  Abdominal pain associated with 2-3 episodes of non-bloody, non-bilious emesis and 1 episode of non-bloody diarrhea. Patient is a transgender female to female and no longer has a menstrual cycle. He is currently being treated for C. Diff with DIFICID and followed by [facility]. Denies vaginal and urinary symptoms. He has not tried anything for his symptoms. No aggravating or alleviating factors.   History obtained from patient and past medical records. No interpreter used during encounter.      Past Medical History:  Diagnosis Date  . Anxiety   . Asthma   . B12 deficiency   . C. difficile diarrhea   . Gallbladder sludge   . Post concussion syndrome   . Severe depression [facility])     Patient Active Problem List   Diagnosis Date Noted  . MDD (major depressive disorder), single episode, severe , no psychosis ([place]) 08/18/2017    Past Surgical History:  Procedure Laterality Date  . brain cyst removal    . CHOLECYSTECTOMY    . LIGAMENT REPAIR Left    radial  . thumb surgery    . ULNAR NERVE REPAIR Left 09/11/2019  . WISDOM TOOTH EXTRACTION       OB History   No obstetric history on file.     Family History  Problem Relation Age of Onset  . Seizures Maternal Aunt   . Heart disease Paternal Aunt   . Diabetes Maternal Grandmother   . Heart disease Maternal Grandmother   . Heart disease Maternal Grandfather   . Deep vein thrombosis Maternal Grandfather   . Hyperlipidemia Maternal  Grandfather   . Stomach cancer Paternal Grandfather   . Colon cancer Neg Hx   . Esophageal cancer Neg Hx   . Pancreatic cancer Neg Hx     Social History   Tobacco Use  . Smoking status: Never Smoker  . Smokeless tobacco: Never Used  Vaping Use  . Vaping Use: Every day  . Substances: Nicotine, THC, Flavoring  Substance Use Topics  . Alcohol use: Yes    Comment: occasionally  . Drug use: Yes    Types: Marijuana    Home Medications Prior to Admission medications   Medication Sig Start Date End Date Taking? Authorizing Provider  albuterol (PROVENTIL HFA;VENTOLIN HFA) 108 (90 Base) MCG/ACT inhaler Inhale 2 puffs into the lungs every 6 (six) hours as needed for wheezing or shortness of breath.   Yes [provider]  amphetamine-dextroamphetamine (ADDERALL XR) 15 MG 24 hr capsule Take 15 mg by mouth every morning.   Yes [provider]  cyanocobalamin 1000 MCG tablet Take 1 tablet by mouth daily. 04/04/18  Yes [provider]  ergocalciferol (VITAMIN D2) 1.25 MG (50000 UT) capsule Take 50,000 Units by mouth once a week.   Yes [provider]  fidaxomicin (DIFICID) 200 MG TABS tablet Take 1 tablet (200 mg total) by mouth 2 (two) times daily. 10/13/19  Yes Levin Erp, PA  loratadine (CLARITIN) 10 MG tablet Take  1 tablet (10 mg total) by mouth daily. (May buy from over the counter): For allergies Patient taking differently: Take 10 mg by mouth daily as needed for allergies.  08/21/17  Yes Armandina Stammer I, NP  omeprazole (PRILOSEC) 20 MG capsule Take one capsule 20-30 minutes prior to breakfast. Patient taking differently: Take 20 mg by mouth daily.  09/07/19  Yes Unk Lightning, PA  ondansetron (ZOFRAN) 4 MG tablet Take 2 tablets (8 mg total) by mouth every 8 (eight) hours as needed for nausea or vomiting. 10/13/19  Yes Unk Lightning, PA  Rimegepant Sulfate (NURTEC) 75 MG TBDP Take 1 tablet by mouth daily as needed (headache).    Yes  [provider]  rizatriptan (MAXALT) 10 MG tablet Take 1 tablet by mouth daily as needed for migraine.  05/25/18  Yes [provider]  testosterone cypionate (DEPOTESTOSTERONE CYPIONATE) 200 MG/ML injection Inject 7.75 mLs into the muscle every 14 (fourteen) days.  08/11/18  Yes [provider]  acetaminophen (TYLENOL 8 HOUR) 650 MG CR tablet Take 1 tablet (650 mg total) by mouth every 8 (eight) hours as needed for pain. 10/25/19   Mannie Stabile, PA-C  ondansetron (ZOFRAN ODT) 4 MG disintegrating tablet Take 1 tablet (4 mg total) by mouth every 8 (eight) hours as needed for nausea or vomiting. 10/25/19   Mannie Stabile, PA-C    Allergies    Bee pollen, Mold extract [trichophyton], Pollen extract, Zyrtec [cetirizine], and Mixed ragweed  Review of Systems   Review of Systems  Constitutional: Negative for chills and fever.  Respiratory: Negative for shortness of breath.   Cardiovascular: Negative for chest pain.  Gastrointestinal: Positive for abdominal pain, diarrhea, nausea and vomiting.  Genitourinary: Negative for dysuria.  All other systems reviewed and are negative.   Physical Exam Updated Vital Signs BP 99/74   Pulse (!) 57   Temp 97.6 F (36.4 C) (Oral)   Resp 16   SpO2 98%   Physical Exam Vitals and nursing note reviewed.  Constitutional:      General: He is not in acute distress.    Appearance: He is not ill-appearing.  HENT:     Head: Normocephalic.  Eyes:     Pupils: Pupils are equal, round, and reactive to light.  Cardiovascular:     Rate and Rhythm: Normal rate and regular rhythm.     Pulses: Normal pulses.     Heart sounds: Normal heart sounds. No murmur heard.  No friction rub. No gallop.   Pulmonary:     Effort: Pulmonary effort is normal.     Breath sounds: Normal breath sounds.  Abdominal:     General: Abdomen is flat. Bowel sounds are normal. There is no distension.     Palpations: Abdomen is soft.     Tenderness:  There is abdominal tenderness. There is guarding. There is no right CVA tenderness, left CVA tenderness or rebound.     Comments: Tenderness to palpation in right lower quadrant with voluntary guarding.  No rebound.  Negative CVA tenderness bilaterally.  Musculoskeletal:     Cervical back: Neck supple.     Comments: Able to move all 4 extremities without difficulty.  Skin:    General: Skin is warm and dry.  Neurological:     General: No focal deficit present.     Mental Status: He is alert.  Psychiatric:        Mood and Affect: Mood normal.  Behavior: Behavior normal.     ED Results / Procedures / Treatments   Labs (all labs ordered are listed, but only abnormal results are displayed) Labs Reviewed  COMPREHENSIVE METABOLIC PANEL - Abnormal; Notable for the following components:      Result Value   Glucose, Bld 109 (*)    Creatinine, Ser 1.14 (*)    Calcium 8.7 (*)    All other components within normal limits  CBC - Abnormal; Notable for the following components:   RBC 5.69 (*)    Hemoglobin 16.2 (*)    HCT 48.2 (*)    All other components within normal limits  URINALYSIS, ROUTINE W REFLEX MICROSCOPIC - Abnormal; Notable for the following components:   Specific Gravity, Urine >1.046 (*)    Ketones, ur 5 (*)    Leukocytes,Ua LARGE (*)    Bacteria, UA RARE (*)    All other components within normal limits  URINE CULTURE  LIPASE, BLOOD  I-STAT BETA HCG BLOOD, ED ([facility], [facility], [facility] ONLY)    EKG None  Radiology CT ABDOMEN PELVIS W CONTRAST  Result Date: 10/25/2019 CLINICAL DATA:  C difficile colitis, right lower quadrant abdominal pain EXAM: CT ABDOMEN AND PELVIS WITH CONTRAST TECHNIQUE: Multidetector CT imaging of the abdomen and pelvis was performed using the standard protocol following bolus administration of intravenous contrast. CONTRAST:  OMNIPAQUE IOHEXOL 300 MG/ML  SOLN COMPARISON:  08/09/2019 FINDINGS: Lower chest: No acute pleural or parenchymal lung disease.  Hepatobiliary: No focal liver abnormality is seen. Status post cholecystectomy. No biliary dilatation. Pancreas: Unremarkable. No pancreatic ductal dilatation or surrounding inflammatory changes. Spleen: Normal in size without focal abnormality. Adrenals/Urinary Tract: Adrenal glands are unremarkable. Kidneys are normal, without renal calculi, focal lesion, or hydronephrosis. Bladder is unremarkable. Stomach/Bowel: No bowel obstruction or ileus. Normal appendix right lower quadrant. No bowel wall thickening or inflammatory change. Specifically, no CT evidence of colitis. Vascular/Lymphatic: No significant vascular findings are present. No enlarged abdominal or pelvic lymph nodes. Reproductive: Uterus and bilateral adnexa are unremarkable. Other: No abdominal wall hernia or abnormality. No abdominopelvic ascites. Musculoskeletal: No acute or destructive bony lesions. Reconstructed images demonstrate no additional findings. IMPRESSION: 1. No acute intra-abdominal or intrapelvic process. Normal appendix. Electronically Signed   By: Sharlet Salina M.D.   On: 10/25/2019 20:59    Procedures Procedures (including critical care time)  Medications Ordered in ED Medications  sodium chloride flush (NS) 0.9 % injection 3 mL (has no administration in time range)  sodium chloride (PF) 0.9 % injection (has no administration in time range)  HYDROmorphone (DILAUDID) injection 1 mg (has no administration in time range)  sodium chloride 0.9 % bolus 1,000 mL (1,000 mLs Intravenous New Bag/Given (Non-Interop) 10/25/19 1952)  ondansetron (ZOFRAN) injection 4 mg (4 mg Intravenous Given 10/25/19 1951)  morphine 4 MG/ML injection 4 mg (4 mg Intravenous Given 10/25/19 1952)  iohexol (OMNIPAQUE) 300 MG/ML solution 100 mL (100 mLs Intravenous Contrast Given 10/25/19 2040)    ED Course  I have reviewed the triage vital signs and the nursing notes.  Pertinent labs & imaging results that were available during my care of the  patient were reviewed by me and considered in my medical decision making (see chart for details).  Clinical Course as of Oct 24 2340  Wed Oct 25, 2019  2329 Leukocytes,Ua(!): LARGE [CA]    Clinical Course User Index [CA] Jesusita Oka   MDM Rules/Calculators/A&P  19 year old transgender female presents to the ED due to RLQ abdominal pain that started this morning.  Patient is currently being treated for C. difficile with Dificid and being followed by [facility].  Denies vaginal and urinary symptoms. Patient denies concerns for STDs at this time. Stable vitals.  Patient in no acute distress and nontoxic-appearing.  Physical exam significant for right lower quadrant tenderness with voluntary guarding.  No rebound.  Routine labs ordered at triage.  Will obtain CT abdomen to rule out appendicitis. IVFs, morphine, and zofran given for symptomatic relief.   CBC reassuring with no leukocytosis.  CMP with mild elevation creatinine at 1.14, but otherwise reassuring.  Lipase normal at 32.  Doubt pancreatitis.  Negative pregnancy test. CT abdomen personally reviewed which demonstrates: IMPRESSION:  1. No acute intra-abdominal or intrapelvic process. Normal appendix.   UA significant for large leukocytes, but rare bacteria. Patient currently denies any vaginal symptoms. Urine culture pending. Will hold off on antibiotics at this time given patient is asymptomatic and is currently being treated for C. Difficile.  Will discharge patient with Tylenol and Zofran for symptomatic relief.  Advised patient to call GI doctor tomorrow to schedule appointment for further evaluation. Strict ED precautions discussed with patient. Patient states understanding and agrees to plan. Patient discharged home in no acute distress and stable vitals  Discussed case with Dr. Silverio Lay who agrees with assessment and plan.  Final Clinical Impression(s) / ED Diagnoses Final diagnoses:  Right lower quadrant  abdominal pain  Nausea vomiting and diarrhea    Rx / DC Orders ED Discharge Orders         Ordered    acetaminophen (TYLENOL 8 HOUR) 650 MG CR tablet  Every 8 hours PRN     Discontinue  Reprint     10/25/19 2338    ondansetron (ZOFRAN ODT) 4 MG disintegrating tablet  Every 8 hours PRN     Discontinue  Reprint     10/25/19 2339           Mannie Stabile, PA-C 10/25/19 2342    Charlynne Pander, MD 10/26/19 [phone]

## 2019-10-25 NOTE — Discharge Instructions (Addendum)
As discussed, your labs were all reassuring.  Your CT scan was unremarkable.  Your appendix was normal.  I am sending you home with Tylenol.  Take as needed for abdominal pain.  Please call your GI doctor tomorrow to schedule appointment for further evaluation.  I am also sending you home with nausea medication.  Take as prescribed.  Return to the ER for new or worsening symptoms.

## 2019-10-25 NOTE — Telephone Encounter (Signed)
The pt calls with complaints of sharp pains on the right side of the abd that radiates to the center of the abd.  He  Has a history of C diff with 2 rounds of vanc.  C Diff persisted so he was put on Dificid has 3 days left.   Diarrhea is much better but the pain is  worse over the last 2 days please advise

## 2019-10-25 NOTE — ED Triage Notes (Signed)
Patient reports he was diagnosed with C.diff and colitis ([place]?) and placed on 2 rounds of vancomycin which made him sicker per patient.   C/O right lower abdominal   A/O Wheelchair in triage.

## 2019-10-26 MED ORDER — TRAMADOL HCL 50 MG PO TABS: 50.0000 mg | ORAL_TABLET | Freq: Four times a day (QID) | ORAL | 0 refills | Status: DC | PRN

## 2019-10-26 NOTE — Telephone Encounter (Signed)
The pt has been advised and prescription faxed to [facility].

## 2019-10-26 NOTE — Telephone Encounter (Signed)
Looks like he had a CT and labs yesterday and all looked good. We can try some Tramadol 50mg  q4-6 hours for pain, #15- no refills.  Finish Dificid.  Thanks-JLL

## 2019-10-26 NOTE — Telephone Encounter (Signed)
Left message on machine to call back  

## 2019-10-26 NOTE — Telephone Encounter (Signed)
Patient is returning your call.  

## 2019-10-27 LAB — URINE CULTURE

## 2019-11-06 ENCOUNTER — Telehealth: Payer: Self-pay | Admitting: Physician Assistant

## 2019-11-06 NOTE — Telephone Encounter (Signed)
The pt states that after 3 rounds of antibiotics for C diff he continues to have nausea, vomiting, stomach pain, and weakness.  He does state that he has no diarrhea.  Please advise

## 2019-11-06 NOTE — Telephone Encounter (Signed)
The pt has been scheduled for endo colon and pre visit with Dr Marina Goodell..The pt has been advised of the information and verbalized understanding.

## 2019-11-06 NOTE — Telephone Encounter (Signed)
He needs to have EGD and Colonoscopy in the [facility] for further eval. If he is no longer having diarrhea we can proceed.  Dr. Marina Goodell is it ok to schedule procedures- has had 3 rounds of abx for cdiff and no diarrhea any more, just other symptoms.  Thanks-JLL

## 2019-11-08 ENCOUNTER — Other Ambulatory Visit: Payer: Self-pay | Admitting: Family Medicine

## 2019-11-08 ENCOUNTER — Ambulatory Visit
Admission: RE | Admit: 2019-11-08 | Discharge: 2019-11-08 | Disposition: A | Discharge status: Home / Self Care | Payer: BC Managed Care – PPO | Source: Ambulatory Visit | Attending: Family Medicine | Admitting: Family Medicine

## 2019-11-08 DIAGNOSIS — M549 Dorsalgia, unspecified: Secondary | ICD-10-CM

## 2019-11-17 ENCOUNTER — Telehealth: Payer: Self-pay | Admitting: Physician Assistant

## 2019-11-17 NOTE — Telephone Encounter (Signed)
[name] has been advised that the pt has already had a work up with office visit and procedures have been scheduled.

## 2019-11-17 NOTE — Telephone Encounter (Signed)
Keri from [facility] called requesting to get the patient in for an evaluation with a provider for current symptoms such as vomiting, diarrhea and abdominal pain.

## 2019-11-20 ENCOUNTER — Encounter (HOSPITAL_COMMUNITY): Payer: Self-pay

## 2019-11-20 ENCOUNTER — Emergency Department (HOSPITAL_COMMUNITY)
Admission: EM | Admit: 2019-11-20 | Discharge: 2019-11-20 | Disposition: A | Discharge status: Home / Self Care | Payer: BC Managed Care – PPO | Attending: Emergency Medicine | Admitting: Emergency Medicine

## 2019-11-20 ENCOUNTER — Other Ambulatory Visit: Payer: Self-pay

## 2019-11-20 DIAGNOSIS — R109 Unspecified abdominal pain: Secondary | ICD-10-CM | POA: Diagnosis present

## 2019-11-20 DIAGNOSIS — R10813 Right lower quadrant abdominal tenderness: Secondary | ICD-10-CM | POA: Diagnosis not present

## 2019-11-20 DIAGNOSIS — J45909 Unspecified asthma, uncomplicated: Secondary | ICD-10-CM | POA: Insufficient documentation

## 2019-11-20 DIAGNOSIS — R197 Diarrhea, unspecified: Secondary | ICD-10-CM | POA: Insufficient documentation

## 2019-11-20 DIAGNOSIS — R1031 Right lower quadrant pain: Secondary | ICD-10-CM

## 2019-11-20 DIAGNOSIS — R112 Nausea with vomiting, unspecified: Secondary | ICD-10-CM | POA: Diagnosis not present

## 2019-11-20 LAB — CBC WITH DIFFERENTIAL/PLATELET
Abs Immature Granulocytes: 0.01 10*3/uL (ref 0.00–0.07)
Basophils Absolute: 0 10*3/uL (ref 0.0–0.1)
Basophils Relative: 1 %
Eosinophils Absolute: 0.1 10*3/uL (ref 0.0–0.5)
Eosinophils Relative: 2 %
HCT: 45.2 % (ref 36.0–46.0)
Hemoglobin: 15.5 g/dL — ABNORMAL HIGH (ref 12.0–15.0)
Immature Granulocytes: 0 %
Lymphocytes Relative: 28 %
Lymphs Abs: 1.7 10*3/uL (ref 0.7–4.0)
MCH: 28.5 pg (ref 26.0–34.0)
MCHC: 34.3 g/dL (ref 30.0–36.0)
MCV: 83.1 fL (ref 80.0–100.0)
Monocytes Absolute: 0.4 10*3/uL (ref 0.1–1.0)
Monocytes Relative: 7 %
Neutro Abs: 3.7 10*3/uL (ref 1.7–7.7)
Neutrophils Relative %: 62 %
Platelets: 195 10*3/uL (ref 150–400)
RBC: 5.44 MIL/uL — ABNORMAL HIGH (ref 3.87–5.11)
RDW: 12.8 % (ref 11.5–15.5)
WBC: 6.1 10*3/uL (ref 4.0–10.5)
nRBC: 0 % (ref 0.0–0.2)

## 2019-11-20 LAB — URINALYSIS, ROUTINE W REFLEX MICROSCOPIC
Bilirubin Urine: NEGATIVE
Glucose, UA: NEGATIVE mg/dL
Hgb urine dipstick: NEGATIVE
Ketones, ur: NEGATIVE mg/dL
Nitrite: NEGATIVE
Protein, ur: NEGATIVE mg/dL
Specific Gravity, Urine: 1.01 (ref 1.005–1.030)
pH: 6 (ref 5.0–8.0)

## 2019-11-20 LAB — COMPREHENSIVE METABOLIC PANEL
ALT: 20 U/L (ref 0–44)
AST: 20 U/L (ref 15–41)
Albumin: 4.6 g/dL (ref 3.5–5.0)
Alkaline Phosphatase: 46 U/L (ref 38–126)
Anion gap: 8 (ref 5–15)
BUN: 9 mg/dL (ref 6–20)
CO2: 24 mmol/L (ref 22–32)
Calcium: 8.9 mg/dL (ref 8.9–10.3)
Chloride: 107 mmol/L (ref 98–111)
Creatinine, Ser: 0.87 mg/dL (ref 0.44–1.00)
GFR calc Af Amer: >60 mL/min (ref >60)
GFR calc non Af Amer: >60 mL/min (ref >60)
Glucose, Bld: 98 mg/dL (ref 70–99)
Potassium: 3.7 mmol/L (ref 3.5–5.1)
Sodium: 139 mmol/L (ref 135–145)
Total Bilirubin: 1 mg/dL (ref 0.3–1.2)
Total Protein: 7.7 g/dL (ref 6.5–8.1)

## 2019-11-20 LAB — PREGNANCY, URINE: Preg Test, Ur: NEGATIVE

## 2019-11-20 LAB — LIPASE, BLOOD: Lipase: 24 U/L (ref 11–51)

## 2019-11-20 MED ORDER — IBUPROFEN 200 MG PO TABS
600.0000 mg | ORAL_TABLET | Freq: Once | ORAL | Status: AC
  Administered 2019-11-20: 600 mg via ORAL
  Filled 2019-11-20: qty 3

## 2019-11-20 MED ORDER — ONDANSETRON HCL 4 MG/2ML IJ SOLN
4.0000 mg | Freq: Once | INTRAMUSCULAR | Status: AC
  Administered 2019-11-20: 4 mg via INTRAVENOUS
  Filled 2019-11-20: qty 2

## 2019-11-20 MED ORDER — PROMETHAZINE HCL 25 MG RE SUPP
25.0000 mg | Freq: Four times a day (QID) | RECTAL | 0 refills | Status: DC | PRN
Start: 2019-11-20 — End: 2019-12-01

## 2019-11-20 MED ORDER — SODIUM CHLORIDE 0.9% FLUSH
3.0000 mL | Freq: Once | INTRAVENOUS | Status: AC
  Administered 2019-11-20: 3 mL via INTRAVENOUS

## 2019-11-20 MED ORDER — LACTATED RINGERS IV BOLUS
1000.0000 mL | Freq: Once | INTRAVENOUS | Status: AC
  Administered 2019-11-20: 1000 mL via INTRAVENOUS

## 2019-11-20 MED ORDER — ACETAMINOPHEN ER 650 MG PO TBCR: 650.0000 mg | EXTENDED_RELEASE_TABLET | Freq: Three times a day (TID) | ORAL | 0 refills | Status: DC | PRN

## 2019-11-20 MED ORDER — NAPROXEN 375 MG PO TABS
375.0000 mg | ORAL_TABLET | Freq: Two times a day (BID) | ORAL | 0 refills | Status: DC
Start: 2019-11-20 — End: 2020-11-04

## 2019-11-20 MED ORDER — HYDROMORPHONE HCL 1 MG/ML IJ SOLN
1.0000 mg | Freq: Once | INTRAMUSCULAR | Status: AC
  Administered 2019-11-20: 1 mg via INTRAVENOUS
  Filled 2019-11-20: qty 1

## 2019-11-20 NOTE — ED Provider Notes (Signed)
[facility]-EMERGENCY DEPT Provider Note   CSN: [id number] Arrival date & time: 11/20/19  7793     History Chief Complaint  Patient presents with  . Emesis  . Back Pain  . Abdominal Pain    [name] is a 19 y.o. adult.  HPI     19 year old comes in a chief complaint of abdominal pain. Patient is undergoing female to female gender transfer.  She is on testosterone.  She also recently was diagnosed with C. difficile colitis but was informed more recently that she has cleared the infection.  She also has history of gallbladder sludge.  Recurrent abdominal pain started this morning.  Pain is located in the lower quadrants and is typical of the pain she has experienced over the past few months.  The pain however is more severe.  There is no associated UTI-like symptoms.  There is no associated bleeding.  Patient has had nausea with vomiting and diarrhea for the last several weeks now.  She typically has 4 rounds of each.  Patient is supposed to see a GI doctor but the appointment is not until August.  There is family history of IBD.  Past Medical History:  Diagnosis Date  . Anxiety   . Asthma   . B12 deficiency   . C. difficile diarrhea   . Gallbladder sludge   . Post concussion syndrome   . Severe depression [facility])     Patient Active Problem List   Diagnosis Date Noted  . MDD (major depressive disorder), single episode, severe , no psychosis (HCC) 08/18/2017    Past Surgical History:  Procedure Laterality Date  . brain cyst removal    . CHOLECYSTECTOMY    . LIGAMENT REPAIR Left    radial  . thumb surgery    . ULNAR NERVE REPAIR Left 09/11/2019  . WISDOM TOOTH EXTRACTION       OB History   No obstetric history on file.     Family History  Problem Relation Age of Onset  . Seizures Maternal Aunt   . Heart disease Paternal Aunt   . Diabetes Maternal Grandmother   . Heart disease Maternal Grandmother   . Heart disease Maternal Grandfather     . Deep vein thrombosis Maternal Grandfather   . Hyperlipidemia Maternal Grandfather   . Stomach cancer Paternal Grandfather   . Colon cancer Neg Hx   . Esophageal cancer Neg Hx   . Pancreatic cancer Neg Hx     Social History   Tobacco Use  . Smoking status: Never Smoker  . Smokeless tobacco: Never Used  Vaping Use  . Vaping Use: Every day  . Substances: Nicotine, THC, Flavoring  Substance Use Topics  . Alcohol use: Yes    Comment: occasionally  . Drug use: Yes    Types: Marijuana    Home Medications Prior to Admission medications   Medication Sig Start Date End Date Taking? Authorizing Provider  acetaminophen (TYLENOL 8 HOUR) 650 MG CR tablet Take 1 tablet (650 mg total) by mouth every 8 (eight) hours as needed for pain. 10/25/19   Mannie Stabile, PA-C  acetaminophen (TYLENOL 8 HOUR) 650 MG CR tablet Take 1 tablet (650 mg total) by mouth every 8 (eight) hours as needed. 11/20/19   Derwood Kaplan, MD  albuterol (PROVENTIL HFA;VENTOLIN HFA) 108 (90 Base) MCG/ACT inhaler Inhale 2 puffs into the lungs every 6 (six) hours as needed for wheezing or shortness of breath.    [provider]  amphetamine-dextroamphetamine (ADDERALL XR) 15 MG 24 hr capsule Take 15 mg by mouth every morning.    [provider]  cyanocobalamin 1000 MCG tablet Take 1 tablet by mouth daily. 04/04/18   [provider]  ergocalciferol (VITAMIN D2) 1.25 MG (50000 UT) capsule Take 50,000 Units by mouth once a week.    [provider]  fidaxomicin (DIFICID) 200 MG TABS tablet Take 1 tablet (200 mg total) by mouth 2 (two) times daily. 10/13/19   Unk LightningLemmon, Jennifer Lynne, PA  loratadine (CLARITIN) 10 MG tablet Take 1 tablet (10 mg total) by mouth daily. (May buy from over the counter): For allergies Patient taking differently: Take 10 mg by mouth daily as needed for allergies.  08/21/17   Armandina StammerNwoko, Agnes I, NP  naproxen (NAPROSYN) 375 MG tablet Take 1 tablet (375 mg total) by mouth 2  (two) times daily. 11/20/19   Derwood KaplanNanavati, Nykole Matos, MD  omeprazole (PRILOSEC) 20 MG capsule Take one capsule 20-30 minutes prior to breakfast. Patient taking differently: Take 20 mg by mouth daily.  09/07/19   Unk LightningLemmon, Jennifer Lynne, PA  ondansetron (ZOFRAN ODT) 4 MG disintegrating tablet Take 1 tablet (4 mg total) by mouth every 8 (eight) hours as needed for nausea or vomiting. 10/25/19   Audelia ActonAberman, Merla Richesaroline C, PA-C  ondansetron (ZOFRAN) 4 MG tablet Take 2 tablets (8 mg total) by mouth every 8 (eight) hours as needed for nausea or vomiting. 10/13/19   Unk LightningLemmon, Jennifer Lynne, PA  promethazine (PHENERGAN) 25 MG suppository Place 1 suppository (25 mg total) rectally every 6 (six) hours as needed for nausea. 11/20/19   Derwood KaplanNanavati, Madellyn Denio, MD  Rimegepant Sulfate (NURTEC) 75 MG TBDP Take 1 tablet by mouth daily as needed (headache).     [provider]  rizatriptan (MAXALT) 10 MG tablet Take 1 tablet by mouth daily as needed for migraine.  05/25/18   [provider]  testosterone cypionate (DEPOTESTOSTERONE CYPIONATE) 200 MG/ML injection Inject 7.75 mLs into the muscle every 14 (fourteen) days.  08/11/18   [provider]  traMADol (ULTRAM) 50 MG tablet Take 1 tablet (50 mg total) by mouth every 6 (six) hours as needed. 10/26/19   Unk LightningLemmon, Jennifer Lynne, PA    Allergies    Bee pollen, Mold extract [trichophyton], Pollen extract, Zyrtec [cetirizine], and Mixed ragweed  Review of Systems   Review of Systems  Constitutional: Positive for activity change.  Respiratory: Negative for shortness of breath.   Cardiovascular: Negative for chest pain.  Gastrointestinal: Positive for abdominal pain, diarrhea, nausea and vomiting.  Allergic/Immunologic: Negative for immunocompromised state.  All other systems reviewed and are negative.   Physical Exam Updated Vital Signs BP 121/82   Pulse 66   Temp 98.3 F (36.8 C) (Oral)   Resp 16   Ht 5\' 9"  (1.753 m)   Wt 99.8 kg   SpO2 100%   BMI 32.49  kg/m   Physical Exam Vitals and nursing note reviewed.  Constitutional:      Appearance: He is well-developed.  HENT:     Head: Normocephalic and atraumatic.  Eyes:     Conjunctiva/sclera: Conjunctivae normal.     Pupils: Pupils are equal, round, and reactive to light.  Cardiovascular:     Rate and Rhythm: Normal rate and regular rhythm.  Pulmonary:     Effort: Pulmonary effort is normal.     Breath sounds: Normal breath sounds.  Abdominal:     General: Bowel sounds are normal. There is no distension.  Palpations: Abdomen is soft. There is no mass.     Tenderness: There is abdominal tenderness. There is guarding. There is no rebound.     Comments: Patient has lower quadrant tenderness with guarding over the right lower quadrant.  Musculoskeletal:        General: No deformity.     Cervical back: Normal range of motion and neck supple.  Skin:    General: Skin is warm.  Neurological:     Mental Status: He is alert and oriented to person, place, and time.     ED Results / Procedures / Treatments   Labs (all labs ordered are listed, but only abnormal results are displayed) Labs Reviewed  URINALYSIS, ROUTINE W REFLEX MICROSCOPIC - Abnormal; Notable for the following components:      Result Value   APPearance HAZY (*)    Leukocytes,Ua LARGE (*)    Bacteria, UA FEW (*)    All other components within normal limits  CBC WITH DIFFERENTIAL/PLATELET - Abnormal; Notable for the following components:   RBC 5.44 (*)    Hemoglobin 15.5 (*)    All other components within normal limits  LIPASE, BLOOD  COMPREHENSIVE METABOLIC PANEL  PREGNANCY, URINE    EKG None  Radiology No results found.  Procedures Procedures (including critical care time)  Medications Ordered in ED Medications  sodium chloride flush (NS) 0.9 % injection 3 mL (3 mLs Intravenous Given 11/20/19 0924)  lactated ringers bolus 1,000 mL (0 mLs Intravenous Stopped 11/20/19 1051)  HYDROmorphone (DILAUDID)  injection 1 mg (1 mg Intravenous Given 11/20/19 0922)  ondansetron (ZOFRAN) injection 4 mg (4 mg Intravenous Given 11/20/19 0922)  ibuprofen (ADVIL) tablet 600 mg (600 mg Oral Given 11/20/19 1049)    ED Course  I have reviewed the triage vital signs and the nursing notes.  Pertinent labs & imaging results that were available during my care of the patient were reviewed by me and considered in my medical decision making (see chart for details).  Clinical Course as of Nov 19 1148  Mon Nov 20, 2019  1100 Patient reports that the pain has improved.  Her labs are overall reassuring.  Oral challenge to be continued.  If she passes she will be discharged with the GI follow-up that is planned.   [AN]  1142 Pt reassessed. Pt's VSS and WNL. Pt's cap refill < 3 seconds. Pt has been hydrated in the ER and now passed po challenge. We will discharge with antiemetic. Strict ER return precautions have been discussed and pt will return if he is unable to tolerate fluids and symptoms are getting worse.    [AN]    Clinical Course User Index [AN] Derwood Kaplan, MD   MDM Rules/Calculators/A&P                          Patient comes in w/ chief complaint of abdominal pain, nausea, vomiting and diarrhea.  The nausea, vomiting and diarrhea has been present for last several days now.  The abdominal pain appears to be intermittently present.  On exam she is having right lower quadrant tenderness with some guarding.  We had considered appendicitis, cystitis, colitis, PID, thrombosis due to testosterone use in the differential diagnosis.  I have reviewed patient's CT scans from March and again in June.  She had presented with similar pain on those occasions and had negative CT scans including no evidence of appendicitis.  My suspicion is that this pain that started  an hour ago is not due to some intra-abdominal surgical pathology.  Basic labs were ordered and they are all reassuring which furthers my believe that  most likely there is no emergent pathology going on.   Plan will be to control on symptom management and oral challenge.  She has a GI follow-up coming up which is reassuring.  Final Clinical Impression(s) / ED Diagnoses Final diagnoses:  Non-surgical right lower quadrant abdominal pain    Rx / DC Orders ED Discharge Orders         Ordered    naproxen (NAPROSYN) 375 MG tablet  2 times daily     Discontinue  Reprint     11/20/19 1149    acetaminophen (TYLENOL 8 HOUR) 650 MG CR tablet  Every 8 hours PRN     Discontinue  Reprint     11/20/19 1149    promethazine (PHENERGAN) 25 MG suppository  Every 6 hours PRN     Discontinue  Reprint     11/20/19 1149           Derwood Kaplan, MD 11/20/19 1150

## 2019-11-20 NOTE — Discharge Instructions (Signed)
All the results in the ER are normal, labs and imaging. We are not sure what is causing your symptoms. The workup in the ER is not complete, and is limited to screening for life threatening and emergent conditions only, so please see a primary care doctor for further evaluation.  

## 2019-11-20 NOTE — ED Triage Notes (Signed)
Patient c/o N/V, lower abdominal pain, and bilateral lower back pain. Patient states she has had a recent CT scan abdomen and x-ray of her back which were negative.

## 2019-11-22 ENCOUNTER — Ambulatory Visit: Payer: BC Managed Care – PPO | Admitting: Gastroenterology

## 2019-12-01 ENCOUNTER — Telehealth: Payer: Self-pay | Admitting: Physician Assistant

## 2019-12-01 ENCOUNTER — Other Ambulatory Visit: Payer: Self-pay

## 2019-12-01 ENCOUNTER — Inpatient Hospital Stay (HOSPITAL_COMMUNITY)
Admission: EM | Admit: 2019-12-01 | Discharge: 2019-12-05 | DRG: 392 | Disposition: A | Discharge status: Home / Self Care | Payer: BC Managed Care – PPO | Attending: Internal Medicine | Admitting: Internal Medicine

## 2019-12-01 ENCOUNTER — Encounter (HOSPITAL_COMMUNITY): Payer: Self-pay | Admitting: Emergency Medicine

## 2019-12-01 DIAGNOSIS — K219 Gastro-esophageal reflux disease without esophagitis: Secondary | ICD-10-CM | POA: Diagnosis present

## 2019-12-01 DIAGNOSIS — Z8619 Personal history of other infectious and parasitic diseases: Secondary | ICD-10-CM | POA: Diagnosis not present

## 2019-12-01 DIAGNOSIS — Z91030 Bee allergy status: Secondary | ICD-10-CM

## 2019-12-01 DIAGNOSIS — Z833 Family history of diabetes mellitus: Secondary | ICD-10-CM

## 2019-12-01 DIAGNOSIS — R197 Diarrhea, unspecified: Secondary | ICD-10-CM

## 2019-12-01 DIAGNOSIS — G43909 Migraine, unspecified, not intractable, without status migrainosus: Secondary | ICD-10-CM | POA: Diagnosis present

## 2019-12-01 DIAGNOSIS — R112 Nausea with vomiting, unspecified: Secondary | ICD-10-CM

## 2019-12-01 DIAGNOSIS — Z20822 Contact with and (suspected) exposure to covid-19: Secondary | ICD-10-CM | POA: Diagnosis present

## 2019-12-01 DIAGNOSIS — Z83438 Family history of other disorder of lipoprotein metabolism and other lipidemia: Secondary | ICD-10-CM

## 2019-12-01 DIAGNOSIS — K580 Irritable bowel syndrome with diarrhea: Secondary | ICD-10-CM | POA: Diagnosis present

## 2019-12-01 DIAGNOSIS — D519 Vitamin B12 deficiency anemia, unspecified: Secondary | ICD-10-CM | POA: Diagnosis present

## 2019-12-01 DIAGNOSIS — Z8249 Family history of ischemic heart disease and other diseases of the circulatory system: Secondary | ICD-10-CM | POA: Diagnosis not present

## 2019-12-01 DIAGNOSIS — Z888 Allergy status to other drugs, medicaments and biological substances status: Secondary | ICD-10-CM | POA: Diagnosis not present

## 2019-12-01 DIAGNOSIS — K591 Functional diarrhea: Secondary | ICD-10-CM | POA: Diagnosis not present

## 2019-12-01 DIAGNOSIS — Z91048 Other nonmedicinal substance allergy status: Secondary | ICD-10-CM

## 2019-12-01 DIAGNOSIS — Z800 Family history of malignant neoplasm of digestive organs: Secondary | ICD-10-CM | POA: Diagnosis not present

## 2019-12-01 DIAGNOSIS — F988 Other specified behavioral and emotional disorders with onset usually occurring in childhood and adolescence: Secondary | ICD-10-CM | POA: Diagnosis present

## 2019-12-01 DIAGNOSIS — R1084 Generalized abdominal pain: Secondary | ICD-10-CM | POA: Diagnosis not present

## 2019-12-01 DIAGNOSIS — R634 Abnormal weight loss: Secondary | ICD-10-CM | POA: Diagnosis not present

## 2019-12-01 DIAGNOSIS — J45909 Unspecified asthma, uncomplicated: Secondary | ICD-10-CM | POA: Diagnosis present

## 2019-12-01 DIAGNOSIS — R1030 Lower abdominal pain, unspecified: Secondary | ICD-10-CM | POA: Diagnosis present

## 2019-12-01 HISTORY — DX: Other specified health status: Z78.9

## 2019-12-01 LAB — SARS CORONAVIRUS 2 BY RT PCR (HOSPITAL ORDER, PERFORMED IN ~~LOC~~ HOSPITAL LAB): SARS Coronavirus 2: NEGATIVE

## 2019-12-01 LAB — COMPREHENSIVE METABOLIC PANEL
ALT: 18 U/L (ref 0–44)
AST: 19 U/L (ref 15–41)
Albumin: 4.7 g/dL (ref 3.5–5.0)
Alkaline Phosphatase: 49 U/L (ref 38–126)
Anion gap: 11 (ref 5–15)
BUN: 12 mg/dL (ref 6–20)
CO2: 26 mmol/L (ref 22–32)
Calcium: 9.4 mg/dL (ref 8.9–10.3)
Chloride: 103 mmol/L (ref 98–111)
Creatinine, Ser: 0.9 mg/dL (ref 0.44–1.00)
GFR calc Af Amer: >60 mL/min (ref >60)
GFR calc non Af Amer: >60 mL/min (ref >60)
Glucose, Bld: 92 mg/dL (ref 70–99)
Potassium: 3.7 mmol/L (ref 3.5–5.1)
Sodium: 140 mmol/L (ref 135–145)
Total Bilirubin: 1 mg/dL (ref 0.3–1.2)
Total Protein: 8 g/dL (ref 6.5–8.1)

## 2019-12-01 LAB — LIPASE, BLOOD: Lipase: 24 U/L (ref 11–51)

## 2019-12-01 LAB — CBC
HCT: 50 % — ABNORMAL HIGH (ref 36.0–46.0)
Hemoglobin: 17 g/dL — ABNORMAL HIGH (ref 12.0–15.0)
MCH: 29.1 pg (ref 26.0–34.0)
MCHC: 34 g/dL (ref 30.0–36.0)
MCV: 85.5 fL (ref 80.0–100.0)
Platelets: 203 10*3/uL (ref 150–400)
RBC: 5.85 MIL/uL — ABNORMAL HIGH (ref 3.87–5.11)
RDW: 12.7 % (ref 11.5–15.5)
WBC: 6.7 10*3/uL (ref 4.0–10.5)
nRBC: 0 % (ref 0.0–0.2)

## 2019-12-01 LAB — I-STAT BETA HCG BLOOD, ED (MC, WL, AP ONLY): I-stat hCG, quantitative: <5 m[IU]/mL (ref <5)

## 2019-12-01 MED ORDER — SODIUM CHLORIDE 0.9 % IV SOLN
1000.0000 mL | INTRAVENOUS | Status: DC
  Administered 2019-12-02 – 2019-12-05 (×5): 1000 mL via INTRAVENOUS

## 2019-12-01 MED ORDER — ONDANSETRON HCL 4 MG/2ML IJ SOLN
4.0000 mg | Freq: Once | INTRAMUSCULAR | Status: AC
  Administered 2019-12-01: 4 mg via INTRAVENOUS
  Filled 2019-12-01: qty 2

## 2019-12-01 MED ORDER — PROMETHAZINE HCL 25 MG RE SUPP
25.0000 mg | Freq: Four times a day (QID) | RECTAL | Status: DC | PRN
  Administered 2019-12-02 – 2019-12-03 (×2): 25 mg via RECTAL
  Filled 2019-12-01 (×2): qty 1

## 2019-12-01 MED ORDER — SUMATRIPTAN SUCCINATE 50 MG PO TABS
50.0000 mg | ORAL_TABLET | ORAL | Status: DC | PRN
  Filled 2019-12-01: qty 1

## 2019-12-01 MED ORDER — FIDAXOMICIN 200 MG PO TABS: 200.0000 mg | ORAL_TABLET | Freq: Two times a day (BID) | ORAL | Status: DC

## 2019-12-01 MED ORDER — ONDANSETRON HCL 4 MG PO TABS: 8.0000 mg | ORAL_TABLET | Freq: Three times a day (TID) | ORAL | Status: DC | PRN

## 2019-12-01 MED ORDER — ALBUTEROL SULFATE (2.5 MG/3ML) 0.083% IN NEBU: 3.0000 mL | INHALATION_SOLUTION | Freq: Four times a day (QID) | RESPIRATORY_TRACT | Status: DC | PRN

## 2019-12-01 MED ORDER — ACETAMINOPHEN 650 MG RE SUPP: 650.0000 mg | Freq: Four times a day (QID) | RECTAL | Status: DC | PRN

## 2019-12-01 MED ORDER — SODIUM CHLORIDE 0.9% FLUSH: 3.0000 mL | Freq: Once | INTRAVENOUS | Status: DC

## 2019-12-01 MED ORDER — ENOXAPARIN SODIUM 40 MG/0.4ML ~~LOC~~ SOLN
40.0000 mg | SUBCUTANEOUS | Status: DC
  Administered 2019-12-01 – 2019-12-02 (×2): 40 mg via SUBCUTANEOUS
  Filled 2019-12-01 (×2): qty 0.4

## 2019-12-01 MED ORDER — ACETAMINOPHEN 325 MG PO TABS: 650.0000 mg | ORAL_TABLET | Freq: Four times a day (QID) | ORAL | Status: DC | PRN

## 2019-12-01 MED ORDER — VITAMIN B-12 1000 MCG PO TABS
1000.0000 ug | ORAL_TABLET | Freq: Every day | ORAL | Status: DC
  Administered 2019-12-02 – 2019-12-05 (×4): 1000 ug via ORAL
  Filled 2019-12-01 (×4): qty 1

## 2019-12-01 MED ORDER — TRAMADOL HCL 50 MG PO TABS
50.0000 mg | ORAL_TABLET | Freq: Four times a day (QID) | ORAL | Status: DC | PRN
  Administered 2019-12-01 – 2019-12-05 (×2): 50 mg via ORAL
  Filled 2019-12-01 (×2): qty 1

## 2019-12-01 MED ORDER — MORPHINE SULFATE (PF) 2 MG/ML IV SOLN
2.0000 mg | INTRAVENOUS | Status: DC | PRN
  Administered 2019-12-01 – 2019-12-05 (×15): 2 mg via INTRAVENOUS
  Filled 2019-12-01 (×15): qty 1

## 2019-12-01 MED ORDER — ONDANSETRON HCL 4 MG/2ML IJ SOLN
4.0000 mg | Freq: Four times a day (QID) | INTRAMUSCULAR | Status: DC | PRN
  Administered 2019-12-01 – 2019-12-02 (×4): 4 mg via INTRAVENOUS
  Filled 2019-12-01 (×6): qty 2

## 2019-12-01 MED ORDER — PANTOPRAZOLE SODIUM 40 MG PO TBEC
40.0000 mg | DELAYED_RELEASE_TABLET | Freq: Every day | ORAL | Status: DC
  Administered 2019-12-02 – 2019-12-05 (×4): 40 mg via ORAL
  Filled 2019-12-01 (×5): qty 1

## 2019-12-01 MED ORDER — LORATADINE 10 MG PO TABS: 10.0000 mg | ORAL_TABLET | Freq: Every day | ORAL | Status: DC | PRN

## 2019-12-01 MED ORDER — ONDANSETRON HCL 4 MG PO TABS: 4.0000 mg | ORAL_TABLET | Freq: Four times a day (QID) | ORAL | Status: DC | PRN

## 2019-12-01 MED ORDER — AMPHETAMINE-DEXTROAMPHET ER 15 MG PO CP24: 15.0000 mg | ORAL_CAPSULE | ORAL | Status: DC

## 2019-12-01 MED ORDER — KETOROLAC TROMETHAMINE 30 MG/ML IJ SOLN
30.0000 mg | Freq: Once | INTRAMUSCULAR | Status: AC
  Administered 2019-12-01: 30 mg via INTRAVENOUS
  Filled 2019-12-01: qty 1

## 2019-12-01 MED ORDER — TRAZODONE HCL 50 MG PO TABS: 25.0000 mg | ORAL_TABLET | Freq: Every evening | ORAL | Status: DC | PRN

## 2019-12-01 MED ORDER — RIMEGEPANT SULFATE 75 MG PO TBDP: 1.0000 | ORAL_TABLET | Freq: Every day | ORAL | Status: DC | PRN

## 2019-12-01 MED ORDER — ONDANSETRON 4 MG PO TBDP: 4.0000 mg | ORAL_TABLET | Freq: Three times a day (TID) | ORAL | Status: DC | PRN

## 2019-12-01 MED ORDER — VITAMIN D (ERGOCALCIFEROL) 1.25 MG (50000 UNIT) PO CAPS
50000.0000 [IU] | ORAL_CAPSULE | ORAL | Status: DC
  Administered 2019-12-05: 50000 [IU] via ORAL
  Filled 2019-12-01: qty 1

## 2019-12-01 MED ORDER — SODIUM CHLORIDE 0.9 % IV BOLUS (SEPSIS)
1000.0000 mL | Freq: Once | INTRAVENOUS | Status: AC
  Administered 2019-12-01: 1000 mL via INTRAVENOUS

## 2019-12-01 MED ORDER — SODIUM CHLORIDE 0.9 % IV SOLN: INTRAVENOUS | Status: DC

## 2019-12-01 NOTE — ED Provider Notes (Signed)
Victorino Dike, provider from [facility].  Called about this patient.  Has had worsening abdominal pain.  Has been C. difficile positive in the past.  Has outpatient endoscopy and colonoscopy for 2 weeks however they do not feel patient can wait.  They would recommend admission to hospitalist.  She will let the [facility] team of patient here in the hospital to consult and recommends  hospitalist to admit.   Dejah Droessler A, PA-C 12/01/19 1146    Geoffery Lyons, MD 12/01/19 1321

## 2019-12-01 NOTE — ED Notes (Signed)
Transport called to take patient to assigned bed. 

## 2019-12-01 NOTE — ED Triage Notes (Signed)
Patient reports abdominal pain, n/v. Patient reports he feels weak and unable to keep down food.

## 2019-12-01 NOTE — ED Provider Notes (Signed)
[name]EMER[facility] DEPT Provider Note   CSN: [id number] Arrival date & time: 12/01/19  1118     History Chief Complaint  Patient presents with  . Abdominal Pain  . Nausea    [name] is a 19 y.o. adult.  HPI   Pt presents to the ED for persistent abdominal pain, vomiting and diarrhea.  Patient had been previously diagnosed with Bruce. difficile.  Patient was treated and ultimately tested negative after treatment.  However the patient started having recurrent episodes of nausea and vomiting as far back as the beginning of June.  Patient was seen by [facility] our gastroenterology on June 4.  Patient was prescribed dificid.   Patient did have a CT scan of the abdomen and pelvis on June 16.  No acute findings were noted at that time.  Patient had an additional ED visit on July 12.  Patient spoke with one of our GI today because over the last couple of weeks patient has had persistent issues with vomiting and diarrhea and abdominal pain.  Pain is primarily in the lower abdomen.  Patient has had frequent loose stools and vomiting especially after eating.  Patient did have weight loss of and has not been keeping anything down.  [facility] contacted the patient and felt the patient should be admitted to the [facility].  They would plan on doing an EGD and a colonoscopy.  Past Medical History:  Diagnosis Date  . Anxiety   . Asthma   . B12 deficiency   . Bruce. difficile diarrhea   . Female-to-female transgender person   . Gallbladder sludge   . Post concussion syndrome   . Severe depression [facility])     Patient Active Problem List   Diagnosis Date Noted  . MDD (major depressive disorder), single episode, severe , no psychosis (HCC) 08/18/2017    Past Surgical History:  Procedure Laterality Date  . brain cyst removal    . CHOLECYSTECTOMY    . LIGAMENT REPAIR Left    radial  . thumb surgery    . ULNAR NERVE REPAIR Left 09/11/2019  . WISDOM TOOTH EXTRACTION       OB History    No obstetric history on file.     Family History  Problem Relation Age of Onset  . Seizures Maternal Aunt   . Heart disease Paternal Aunt   . Diabetes Maternal Grandmother   . Heart disease Maternal Grandmother   . Heart disease Maternal Grandfather   . Deep vein thrombosis Maternal Grandfather   . Hyperlipidemia Maternal Grandfather   . Stomach cancer Paternal Grandfather   . Colon cancer Neg Hx   . Esophageal cancer Neg Hx   . Pancreatic cancer Neg Hx     Social History   Tobacco Use  . Smoking status: Never Smoker  . Smokeless tobacco: Never Used  Vaping Use  . Vaping Use: Every day  . Substances: Nicotine, THC, Flavoring  Substance Use Topics  . Alcohol use: Yes    Comment: occasionally  . Drug use: Yes    Types: Marijuana    Home Medications Prior to Admission medications   Medication Sig Start Date End Date Taking? Authorizing Provider  acetaminophen (TYLENOL 8 HOUR) 650 MG CR tablet Take 1 tablet (650 mg total) by mouth every 8 (eight) hours as needed for pain. 10/25/19   Cynthia Stabile, PA-[facility]  acetaminophen (TYLENOL 8 HOUR) 650 MG CR tablet Take 1 tablet (650 mg total) by mouth every 8 (eight)  hours as needed. 11/20/19   [name], Ankit, MD  albuterol (PROVENTIL HFA;VENTOLIN HFA) 108 (90 Base) MCG/ACT inhaler Inhale 2 puffs into the lungs every 6 (six) hours as needed for wheezing or shortness of breath.    [provider]  amphetamine-dextroamphetamine (ADDERALL XR) 15 MG 24 hr capsule Take 15 mg by mouth every morning.    [provider]  cyanocobalamin 1000 MCG tablet Take 1 tablet by mouth daily. 04/04/18   [provider]  ergocalciferol (VITAMIN D2) 1.25 MG (50000 UT) capsule Take 50,000 Units by mouth once a week.    [provider]  fidaxomicin (DIFICID) 200 MG TABS tablet Take 1 tablet (200 mg total) by mouth 2 (two) times daily. 10/13/19   Unk [facility], Cynthia Lynne, PA  loratadine (CLARITIN) 10 MG tablet Take 1 tablet  (10 mg total) by mouth daily. (May buy from over the counter): For allergies Patient taking differently: Take 10 mg by mouth daily as needed for allergies.  08/21/17   Armandina StammerNwoko, Cynthia I, NP  naproxen (NAPROSYN) 375 MG tablet Take 1 tablet (375 mg total) by mouth 2 (two) times daily. 11/20/19   [name], Ankit, MD  omeprazole (PRILOSEC) 20 MG capsule Take one capsule 20-30 minutes prior to breakfast. Patient taking differently: Take 20 mg by mouth daily.  09/07/19   Unk [facility], Cynthia Lynne, PA  ondansetron (ZOFRAN ODT) 4 MG disintegrating tablet Take 1 tablet (4 mg total) by mouth every 8 (eight) hours as needed for nausea or vomiting. 10/25/19   [name], Cynthia Richesaroline C, PA-[facility]  ondansetron (ZOFRAN) 4 MG tablet Take 2 tablets (8 mg total) by mouth every 8 (eight) hours as needed for nausea or vomiting. 10/13/19   Unk [facility], Cynthia Lynne, PA  promethazine (PHENERGAN) 25 MG suppository Place 1 suppository (25 mg total) rectally every 6 (six) hours as needed for nausea. 11/20/19   [name], Ankit, MD  Rimegepant Sulfate (NURTEC) 75 MG TBDP Take 1 tablet by mouth daily as needed (headache).     [provider]  rizatriptan (MAXALT) 10 MG tablet Take 1 tablet by mouth daily as needed for migraine.  05/25/18   [provider]  testosterone cypionate (DEPOTESTOSTERONE CYPIONATE) 200 MG/ML injection Inject 7.75 mLs into the muscle every 14 (fourteen) days.  08/11/18   [provider]  traMADol (ULTRAM) 50 MG tablet Take 1 tablet (50 mg total) by mouth every 6 (six) hours as needed. 10/26/19   Unk [facility], Cynthia Lynne, PA    Allergies    Bee pollen, Mold extract [trichophyton], Pollen extract, Zyrtec [cetirizine], and Mixed ragweed  Review of Systems   Review of Systems  All other systems reviewed and are negative.   Physical Exam Updated Vital Signs BP (!) 134/99 (BP Location: Right Arm)   Pulse 58   Temp 98.7 F (37.1 Bruce) (Oral)   Resp 18   SpO2 100%   Physical Exam Vitals and nursing note  reviewed.  Constitutional:      General: He is not in acute distress.    Appearance: He is well-developed.  HENT:     Head: Normocephalic and atraumatic.     Right Ear: External ear normal.     Left Ear: External ear normal.  Eyes:     General: No scleral icterus.       Right eye: No discharge.        Left eye: No discharge.     Conjunctiva/sclera: Conjunctivae normal.  Neck:     Trachea: No tracheal deviation.  Cardiovascular:  Rate and Rhythm: Normal rate and regular rhythm.  Pulmonary:     Effort: Pulmonary effort is normal. No respiratory distress.     Breath sounds: Normal breath sounds. No stridor. No wheezing or rales.  Abdominal:     General: Bowel sounds are normal. There is no distension.     Palpations: Abdomen is soft.     Tenderness: There is abdominal tenderness. There is no guarding or rebound.     Comments: Lower abdomen  Musculoskeletal:        General: No tenderness.     Cervical back: Neck supple.  Skin:    General: Skin is warm and dry.     Findings: No rash.  Neurological:     Mental Status: He is alert.     Cranial Nerves: No cranial nerve deficit (no facial droop, extraocular movements intact, no slurred speech).     Sensory: No sensory deficit.     Motor: No abnormal muscle tone or seizure activity.     Coordination: Coordination normal.     ED Results / Procedures / Treatments   Labs (all labs ordered are listed, but only abnormal results are displayed) Labs Reviewed  CBC - Abnormal; Notable for the following components:      Result Value   RBC 5.85 (*)    Hemoglobin 17.0 (*)    HCT 50.0 (*)    All other components within normal limits  LIPASE, BLOOD  COMPREHENSIVE METABOLIC PANEL  URINALYSIS, ROUTINE W REFLEX MICROSCOPIC  Bruce-STAT BETA HCG BLOOD, ED ([facility], [facility], [facility] ONLY)    EKG None  Radiology No results found.  Procedures Procedures (including critical care time)  Medications Ordered in ED Medications  sodium chloride flush  (NS) 0.9 % injection 3 mL (has no administration in time range)  sodium chloride 0.9 % bolus 1,000 mL (has no administration in time range)    Followed by  0.9 %  sodium chloride infusion (has no administration in time range)  ondansetron (ZOFRAN) injection 4 mg (has no administration in time range)  ketorolac (TORADOL) 30 MG/ML injection 30 mg (has no administration in time range)    ED Course  [facility] have reviewed the triage vital signs and the nursing notes.  Pertinent labs & imaging results that were available during my care of the patient were reviewed by me and considered in my medical decision making (see chart for details).  Clinical Course as of Nov 30 1849  Fri Dec 01, 2019  1847 Patient's laboratory tests have been reviewed.  No leukocytosis.  Electrolyte is unremarkable.  No signs of significant dehydration.  Lipase is normal.   [JK]  1848 Prior visits reviewed and no evidence of acute findings on CT scan last month.   [JK]    Clinical Course User Index [JK] Linwood Dibbles, MD   MDM Rules/Calculators/A&P                          Patient presented with persistent abdominal pain vomiting and diarrhea.  Patient is hemodynamically stable.  Laboratory tests are unremarkable.  Patient's had recurrent outpatient visits to try to determine the etiology of her symptoms.  Patient was sent in by her GI doctor for admission of the [facility] for colonoscopy and endoscopy.  [facility] will start the patient on IV fluids and antiemetics.  [facility] will consult the medical service for admission. Final Clinical Impression(s) / ED Diagnoses Final diagnoses:  Lower abdominal pain  Nausea and  vomiting, intractability of vomiting not specified, unspecified vomiting type  Diarrhea, unspecified type      Linwood Dibbles, MD 12/01/19 [phone]

## 2019-12-01 NOTE — Telephone Encounter (Signed)
Called and spoke with Dr. Clelia Croft personally.  She is worried regarding the patient's care given that he has been seen in her clinic at least twice a week over the past couple of weeks and she is not sure exactly what to do with him anymore.  At time of our conversation patient was in the ER.  I went ahead and called the ER and had them admit the patient to the hospitalist service so that we can see him in consult at the hospital.  Would likely benefit from an EGD and colonoscopy as these were set up outpatient in about 2 weeks.  Hyacinth Meeker, PA-C

## 2019-12-01 NOTE — H&P (Signed)
at [facility]   PATIENT NAME: [name]    MR#:  [id number]  DATE OF BIRTH:  [birth year]  DATE OF ADMISSION:  12/01/2019  PRIMARY CARE PHYSICIAN: Sherren Mocha, MD   REQUESTING/REFERRING PHYSICIAN: Linwood Dibbles, MD  CHIEF COMPLAINT:   Chief Complaint  Patient presents with  . Abdominal Pain  . Nausea    HISTORY OF PRESENT ILLNESS:  [name]  is a 19 y.o. African-American transgender adult  who identifies himself as a female with a known history of asthma, and anxiety, migraine, ADD, depression C. difficile colitis for which he was treated on an outpatient basis and tested -3 weeks ago after completion of Dificid course, who presented to the emergency room with an onset of recurrent nausea vomiting with associated diarrhea which have been worsening for the last 3 weeks with no fever or chills.  No bloody diarrhea or melena.  No coffee-ground emesis or bloody vomitus.  He has been having lower abdominal pain initiated now we can generalized.  Note dyspnea or cough or wheezing.  Is been having occasional chest pain with no palpitations.  No dysuria, oliguria or hematuria or flank pain  Upon presentation to the emergency room, blood pressure was 134/99 with otherwise normal vital signs.  Labs revealed unremarkable CMP with negative lipase of 24 and CBC showing hemoconcentration.  Urinalysis showed 11-20 WBCs with positive hyaline casts and few bacteria and large leukocytes.  The patient was given 40 mg of IV Toradol, 4 mg of IV Zofran and 1 L bolus of IV normal saline of 125 mL/h.  He will be admitted to a medical monitored bed for further evaluation and management.  PAST MEDICAL HISTORY:   Past Medical History:  Diagnosis Date  . Anxiety   . Asthma   . B12 deficiency   . C. difficile diarrhea   . Female-to-female transgender person   . Gallbladder sludge   . Post concussion syndrome   . Severe depression (HCC)     PAST SURGICAL HISTORY:   Past Surgical  History:  Procedure Laterality Date  . brain cyst removal    . CHOLECYSTECTOMY    . LIGAMENT REPAIR Left    radial  . thumb surgery    . ULNAR NERVE REPAIR Left 09/11/2019  . WISDOM TOOTH EXTRACTION      SOCIAL HISTORY:   Social History   Tobacco Use  . Smoking status: Never Smoker  . Smokeless tobacco: Never Used  Substance Use Topics  . Alcohol use: Yes    Comment: occasionally    FAMILY HISTORY:   Family History  Problem Relation Age of Onset  . Seizures Maternal Aunt   . Heart disease Paternal Aunt   . Diabetes Maternal Grandmother   . Heart disease Maternal Grandmother   . Heart disease Maternal Grandfather   . Deep vein thrombosis Maternal Grandfather   . Hyperlipidemia Maternal Grandfather   . Stomach cancer Paternal Grandfather   . Colon cancer Neg Hx   . Esophageal cancer Neg Hx   . Pancreatic cancer Neg Hx     DRUG ALLERGIES:   Allergies  Allergen Reactions  . Bee Pollen Anaphylaxis  . Mold Extract [Trichophyton] Anaphylaxis and Itching  . Pollen Extract Anaphylaxis  . Zyrtec [Cetirizine] Anaphylaxis  . Mixed Ragweed Itching    REVIEW OF SYSTEMS:   ROS As per history of present illness. All pertinent systems were reviewed above. Constitutional, HEENT, cardiovascular, respiratory, GI, GU, musculoskeletal, neuro,  psychiatric, endocrine, integumentary and hematologic systems were reviewed and are otherwise negative/unremarkable except for positive findings mentioned above in the HPI.   MEDICATIONS AT HOME:   Prior to Admission medications   Medication Sig Start Date End Date Taking? Authorizing Provider  acetaminophen (TYLENOL 8 HOUR) 650 MG CR tablet Take 1 tablet (650 mg total) by mouth every 8 (eight) hours as needed for pain. 10/25/19   Mannie Stabile, PA-C  acetaminophen (TYLENOL 8 HOUR) 650 MG CR tablet Take 1 tablet (650 mg total) by mouth every 8 (eight) hours as needed. 11/20/19   Derwood Kaplan, MD  albuterol (PROVENTIL HFA;VENTOLIN  HFA) 108 (90 Base) MCG/ACT inhaler Inhale 2 puffs into the lungs every 6 (six) hours as needed for wheezing or shortness of breath.    [provider]  amphetamine-dextroamphetamine (ADDERALL XR) 15 MG 24 hr capsule Take 15 mg by mouth every morning.    [provider]  cyanocobalamin 1000 MCG tablet Take 1 tablet by mouth daily. 04/04/18   [provider]  ergocalciferol (VITAMIN D2) 1.25 MG (50000 UT) capsule Take 50,000 Units by mouth once a week.    [provider]  fidaxomicin (DIFICID) 200 MG TABS tablet Take 1 tablet (200 mg total) by mouth 2 (two) times daily. 10/13/19   Unk Lightning, PA  loratadine (CLARITIN) 10 MG tablet Take 1 tablet (10 mg total) by mouth daily. (May buy from over the counter): For allergies Patient taking differently: Take 10 mg by mouth daily as needed for allergies.  08/21/17   Armandina Stammer I, NP  naproxen (NAPROSYN) 375 MG tablet Take 1 tablet (375 mg total) by mouth 2 (two) times daily. 11/20/19   Derwood Kaplan, MD  omeprazole (PRILOSEC) 20 MG capsule Take one capsule 20-30 minutes prior to breakfast. Patient taking differently: Take 20 mg by mouth daily.  09/07/19   Unk Lightning, PA  ondansetron (ZOFRAN ODT) 4 MG disintegrating tablet Take 1 tablet (4 mg total) by mouth every 8 (eight) hours as needed for nausea or vomiting. 10/25/19   Audelia Acton, Merla Riches, PA-C  ondansetron (ZOFRAN) 4 MG tablet Take 2 tablets (8 mg total) by mouth every 8 (eight) hours as needed for nausea or vomiting. 10/13/19   Unk Lightning, PA  promethazine (PHENERGAN) 25 MG suppository Place 1 suppository (25 mg total) rectally every 6 (six) hours as needed for nausea. 11/20/19   Derwood Kaplan, MD  Rimegepant Sulfate (NURTEC) 75 MG TBDP Take 1 tablet by mouth daily as needed (headache).     [provider]  rizatriptan (MAXALT) 10 MG tablet Take 1 tablet by mouth daily as needed for migraine.  05/25/18   [provider]    testosterone cypionate (DEPOTESTOSTERONE CYPIONATE) 200 MG/ML injection Inject 7.75 mLs into the muscle every 14 (fourteen) days.  08/11/18   [provider]  traMADol (ULTRAM) 50 MG tablet Take 1 tablet (50 mg total) by mouth every 6 (six) hours as needed. 10/26/19   Unk Lightning, PA      VITAL SIGNS:  Blood pressure (!) 134/99, pulse 58, temperature 98.7 F (37.1 C), temperature source Oral, resp. rate 18, SpO2 100 %.  PHYSICAL EXAMINATION:  Physical Exam  GENERAL:  19 y.o.-year-old African-American patient lying in the bed with no acute distress.  EYES: Pupils equal, round, reactive to light and accommodation. No scleral icterus. Extraocular muscles intact.  HEENT: Head atraumatic, normocephalic. Oropharynx and nasopharynx clear.  NECK:  Supple, no jugular venous distention. No  thyroid enlargement, no tenderness.  LUNGS: Normal breath sounds bilaterally, no wheezing, rales,rhonchi or crepitation. No use of accessory muscles of respiration.  CARDIOVASCULAR: Regular rate and rhythm, S1, S2 normal. No murmurs, rubs, or gallops.  ABDOMEN: Soft, with mild distention diffuse generalized tenderness without rebound tenderness guarding or rigidity.. Bowel sounds present. No organomegaly or mass.  EXTREMITIES: No pedal edema, cyanosis, or clubbing.  NEUROLOGIC: Cranial nerves II through XII are intact. Muscle strength 5/5 in all extremities. Sensation intact. Gait not checked.  PSYCHIATRIC: The patient is alert and oriented x 3.  Normal affect and good eye contact. SKIN: No obvious rash, lesion, or ulcer.   LABORATORY PANEL:   CBC Recent Labs  Lab 12/01/19 1325  WBC 6.7  HGB 17.0*  HCT 50.0*  PLT 203   ------------------------------------------------------------------------------------------------------------------  Chemistries  Recent Labs  Lab 12/01/19 1325  NA 140  K 3.7  CL 103  CO2 26  GLUCOSE 92  BUN 12  CREATININE 0.90  CALCIUM 9.4  AST 19  ALT 18   ALKPHOS 49  BILITOT 1.0   ------------------------------------------------------------------------------------------------------------------  Cardiac Enzymes No results for input(s): TROPONINI in the last 168 hours. ------------------------------------------------------------------------------------------------------------------  RADIOLOGY:  No results found.    IMPRESSION AND PLAN:   1.  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  Differential diagnosis would include acute gastroenteritis. -The patient will be admitted to a medically monitored bed. -He will be kept n.p.o. after midnight. -We will hydrate with IV normal saline. -Stool studies will be obtained. -NSAID will be stopped. -The patient will have a GI consultation as planned. -I notified Dr. Yancey Flemings about the patient and he is aware. -Upper or lower GI endoscopy will be planned while the patient is here.  2.  GERD. -PPI therapy will be resumed.  3.  ADD. -Adderall XR be continued.  4.  Vitamin B12 deficiency. -Vitamin B12 be continued.  5.  Migraine headache. -No current active migraine.  6.  DVT prophylaxis. -Subcutaneous Lovenox.   All the records are reviewed and case discussed with ED provider. The plan of care was discussed in details with the patient (and family). I answered all questions. The patient agreed to proceed with the above mentioned plan. Further management will depend upon hospital course.   CODE STATUS: Full code  Status is: Inpatient  Remains inpatient appropriate because:Ongoing active pain requiring inpatient pain management, Ongoing diagnostic testing needed not appropriate for outpatient work up, Unsafe d/c plan, IV treatments appropriate due to intensity of illness or inability to take PO and Inpatient level of care appropriate due to severity of illness   Dispo: The patient is from: Home               Anticipated d/c is to: Home              Anticipated d/c date is: 2 days              Patient currently is not medically stable to d/c.   TOTAL TIME TAKING CARE OF THIS PATIENT: 55 minutes.    Hannah Beat M.D on 12/01/2019 at 7:48 PM  [facility]   From 7 PM-7 AM, contact night-coverage [email or web address]  CC: Primary care physician; Sherren Mocha, MD   Note: This dictation was prepared with Dragon dictation along with smaller phrase technology. Any transcriptional typo errors that result from this process are unintentional.

## 2019-12-01 NOTE — Telephone Encounter (Signed)
Dr Clelia Croft would like a call from Hyacinth Meeker directly to discuss the pt.  Victorino Dike please call her personal cell at  [phone]

## 2019-12-02 DIAGNOSIS — F988 Other specified behavioral and emotional disorders with onset usually occurring in childhood and adolescence: Secondary | ICD-10-CM

## 2019-12-02 DIAGNOSIS — R1030 Lower abdominal pain, unspecified: Secondary | ICD-10-CM

## 2019-12-02 DIAGNOSIS — R197 Diarrhea, unspecified: Secondary | ICD-10-CM

## 2019-12-02 DIAGNOSIS — R112 Nausea with vomiting, unspecified: Secondary | ICD-10-CM

## 2019-12-02 DIAGNOSIS — R634 Abnormal weight loss: Secondary | ICD-10-CM

## 2019-12-02 DIAGNOSIS — R1084 Generalized abdominal pain: Secondary | ICD-10-CM

## 2019-12-02 DIAGNOSIS — K219 Gastro-esophageal reflux disease without esophagitis: Secondary | ICD-10-CM

## 2019-12-02 DIAGNOSIS — D519 Vitamin B12 deficiency anemia, unspecified: Secondary | ICD-10-CM

## 2019-12-02 LAB — CBC
HCT: 43.3 % (ref 36.0–46.0)
Hemoglobin: 14.7 g/dL (ref 12.0–15.0)
MCH: 28.9 pg (ref 26.0–34.0)
MCHC: 33.9 g/dL (ref 30.0–36.0)
MCV: 85.1 fL (ref 80.0–100.0)
Platelets: 179 10*3/uL (ref 150–400)
RBC: 5.09 MIL/uL (ref 3.87–5.11)
RDW: 12.5 % (ref 11.5–15.5)
WBC: 5.4 10*3/uL (ref 4.0–10.5)
nRBC: 0 % (ref 0.0–0.2)

## 2019-12-02 LAB — BASIC METABOLIC PANEL
Anion gap: 9 (ref 5–15)
BUN: 10 mg/dL (ref 6–20)
CO2: 24 mmol/L (ref 22–32)
Calcium: 8.7 mg/dL — ABNORMAL LOW (ref 8.9–10.3)
Chloride: 105 mmol/L (ref 98–111)
Creatinine, Ser: 0.77 mg/dL (ref 0.44–1.00)
GFR calc Af Amer: >60 mL/min (ref >60)
GFR calc non Af Amer: >60 mL/min (ref >60)
Glucose, Bld: 87 mg/dL (ref 70–99)
Potassium: 3.7 mmol/L (ref 3.5–5.1)
Sodium: 138 mmol/L (ref 135–145)

## 2019-12-02 LAB — MAGNESIUM: Magnesium: 1.9 mg/dL (ref 1.7–2.4)

## 2019-12-02 LAB — C-REACTIVE PROTEIN: CRP: <0.5 mg/dL (ref <1.0)

## 2019-12-02 LAB — HIV ANTIBODY (ROUTINE TESTING W REFLEX): HIV Screen 4th Generation wRfx: NONREACTIVE

## 2019-12-02 LAB — SEDIMENTATION RATE: Sed Rate: 2 mm/hr (ref 0–22)

## 2019-12-02 MED ORDER — DICYCLOMINE HCL 10 MG PO CAPS
10.0000 mg | ORAL_CAPSULE | Freq: Three times a day (TID) | ORAL | Status: DC
  Administered 2019-12-02 – 2019-12-05 (×9): 10 mg via ORAL
  Filled 2019-12-02 (×9): qty 1

## 2019-12-02 NOTE — Progress Notes (Signed)
PROGRESS NOTE    [name]  KVQ:[id number] DOB: Dec 25, 2000 DOA: 12/01/2019 PCP: Sherren Mocha, MD    Chief Complaint  Patient presents with   Abdominal Pain   Nausea    Brief Narrative:  HPI per Dr. Princess Perna  is a 19 y.o. African-American transgender adult  who identifies himself as a female with a known history of asthma, and anxiety, migraine, ADD, depression C. difficile colitis for which he was treated on an outpatient basis and tested -3 weeks ago after completion of Dificid course, who presented to the emergency room with an onset of recurrent nausea vomiting with associated diarrhea which have been worsening for the last 3 weeks with no fever or chills.  No bloody diarrhea or melena.  No coffee-ground emesis or bloody vomitus.  He has been having lower abdominal pain initiated now we can generalized.  Note dyspnea or cough or wheezing.  Is been having occasional chest pain with no palpitations.  No dysuria, oliguria or hematuria or flank pain  Upon presentation to the emergency room, blood pressure was 134/99 with otherwise normal vital signs.  Labs revealed unremarkable CMP with negative lipase of 24 and CBC showing hemoconcentration.  Urinalysis showed 11-20 WBCs with positive hyaline casts and few bacteria and large leukocytes.  The patient was given 40 mg of IV Toradol, 4 mg of IV Zofran and 1 L bolus of IV normal saline of 125 mL/h.  He will be admitted to a medical monitored bed for further evaluation and management.   Assessment & Plan:   Active Problems:   Intractable nausea and vomiting   Diarrhea   Lower abdominal pain   Nausea and vomiting   Attention deficit disorder   Gastroesophageal reflux disease   Anemia due to vitamin B12 deficiency  #1 intractable nausea and vomiting with diarrhea and abdominal pain concerning for recurrent C. difficile colitis Patient presented with nausea vomiting diarrhea and some abdominal pain.  Patient treated in the  outpatient setting for C. difficile colitis and per patient with repeat C. difficile negative.  Patient presenting back with recurrent nausea vomiting abdominal pain and diarrhea.  Patient admitted.  Stool studies pending.  GI consulted and patient seen in consultation by Dr.[name] and patient placed on a clear liquid diet with advancement to full liquid diet as tolerated.  GI recommending awaiting stool studies.  Sed rate and CRP ordered.  Per GI if infectious work-up is negative will proceed with endoscopic evaluation of upper endoscopy and colonoscopy and also consideration of starting patient on colestipol for possible bile salt related diarrhea inpatient post cholecystectomy..  Patient started on Bentyl 3 times daily.  Appreciate GI input and recommendations.  2.  GERD PPI.  3.  ADD Adderall XR.  4.  Vitamin B12 deficiency Continue vitamin B12 supplementation.  5.  Migraine headaches Stable.    DVT prophylaxis: Lovenox Code Status: Full Family Communication: Updated patient.  Updated friend at bedside. Disposition:   Status is: Inpatient    Dispo: The patient is from: Home              Anticipated d/c is to: Home              Anticipated d/c date is: 2 to 3 days.              Patient currently being evaluated by GI for ongoing diarrhea, abdominal pain, nausea.       Consultants:   Gastroenterology: Dr. Marina Goodell 12/02/2019  Procedures:  None  Antimicrobials:   None   Subjective: Patient sitting up in bed on the computer.  Friend at bedside.  Denies any chest pain.  No shortness of breath.  Has not had any loose stools today.  Passing flatus.  Still with some abdominal pain.  No emesis.  Objective: Vitals:   12/02/19 0005 12/02/19 0405 12/02/19 0815 12/02/19 1309  BP: 117/77 107/65 114/73 117/68  Pulse: 51 51 48 51  Resp: 17 16 16 16   Temp: (!) 97.3 F (36.3 C) (!) 97.3 F (36.3 C) 97.7 F (36.5 C) (!) 97.3 F (36.3 C)  TempSrc: Oral Oral Oral Oral  SpO2:  98% 98% 99% 99%  Weight:       No intake or output data in the 24 hours ending 12/02/19 1906 Filed Weights   12/01/19 2017  Weight: (!) 99.5 kg    Examination:  General exam: Appears calm and comfortable  Respiratory system: Clear to auscultation. Respiratory effort normal. Cardiovascular system: S1 & S2 heard, RRR. No JVD, murmurs, rubs, gallops or clicks. No pedal edema. Gastrointestinal system: Abdomen is nondistended, soft and some tenderness to palpation diffusely.  No rebound.  No guarding.  Positive bowel sounds.  Central nervous system: Alert and oriented. No focal neurological deficits. Extremities: Symmetric 5 x 5 power. Skin: No rashes, lesions or ulcers Psychiatry: Judgement and insight appear normal. Mood & affect appropriate.     Data Reviewed: I have personally reviewed following labs and imaging studies  CBC: Recent Labs  Lab 12/01/19 1325 12/02/19 0644  WBC 6.7 5.4  HGB 17.0* 14.7  HCT 50.0* 43.3  MCV 85.5 85.1  PLT 203 179    Basic Metabolic Panel: Recent Labs  Lab 12/01/19 1325 12/02/19 0644 12/02/19 0919  NA 140 138  --   K 3.7 3.7  --   CL 103 105  --   CO2 26 24  --   GLUCOSE 92 87  --   BUN 12 10  --   CREATININE 0.90 0.77  --   CALCIUM 9.4 8.7*  --   MG  --   --  1.9    GFR: Estimated Creatinine Clearance (by C-G formula based on SCr of 0.77 mg/dL) Female: 12/04/19 mL/min Female: 169.8 mL/min  Liver Function Tests: Recent Labs  Lab 12/01/19 1325  AST 19  ALT 18  ALKPHOS 49  BILITOT 1.0  PROT 8.0  ALBUMIN 4.7    CBG: No results for input(s): GLUCAP in the last 168 hours.   Recent Results (from the past 240 hour(s))  SARS Coronavirus 2 by RT PCR (hospital order, performed in [facility] hospital lab) Nasopharyngeal Nasopharyngeal Swab     Status: None   Collection Time: 12/01/19  7:34 PM   Specimen: Nasopharyngeal Swab  Result Value Ref Range Status   SARS Coronavirus 2 NEGATIVE NEGATIVE Final    Comment:  (NOTE) SARS-CoV-2 target nucleic acids are NOT DETECTED.  The SARS-CoV-2 RNA is generally detectable in upper and lower respiratory specimens during the acute phase of infection. The lowest concentration of SARS-CoV-2 viral copies this assay can detect is 250 copies / mL. A negative result does not preclude SARS-CoV-2 infection and should not be used as the sole basis for treatment or other patient management decisions.  A negative result may occur with improper specimen collection / handling, submission of specimen other than nasopharyngeal swab, presence of viral mutation(s) within the areas targeted by this assay, and inadequate number of viral copies (<250 copies /  mL). A negative result must be combined with clinical observations, patient history, and epidemiological information.  Fact Sheet for Patients:   BoilerBrush.com.cy  Fact Sheet for Healthcare Providers: https://pope.com/  This test is not yet approved or  cleared by the [place] FDA and has been authorized for detection and/or diagnosis of SARS-CoV-2 by FDA under an Emergency Use Authorization (EUA).  This EUA will remain in effect (meaning this test can be used) for the duration of the COVID-19 declaration under Section 564(b)(1) of the Act, 21 U.S.C. section 360bbb-3(b)(1), unless the authorization is terminated or revoked sooner.  Performed at [facility], [address] [place], [place] [address]          Radiology Studies: No results found.      Scheduled Meds:  dicyclomine  10 mg Oral TID AC   enoxaparin (LOVENOX) injection  40 mg Subcutaneous Q24H   pantoprazole  40 mg Oral Daily   sodium chloride flush  3 mL Intravenous Once   cyanocobalamin  1,000 mcg Oral Daily   [START ON 12/05/2019] Vitamin D (Ergocalciferol)  50,000 Units Oral Weekly   Continuous Infusions:  sodium chloride 1,000 mL (12/02/19 1855)      LOS: 1 day    Time spent: 35 minutes    Ramiro Harvest, MD [facility]   To contact the attending provider between 7A-7P or the covering provider during after hours 7P-7A, please log into the web site [email or web address] and access using universal  password for that web site. If you do not have the password, please call the hospital operator.  12/02/2019, 7:06 PM

## 2019-12-02 NOTE — Consult Note (Addendum)
Consultation  Referring Provider: [facility] Primary Care Physician:  Sherren Mocha, MD Primary Gastroenterologist:  Dr Marina Goodell.  Reason for Consultation: Diarrhea, abdominal pain,  nausea  HPI: [name] is a 19 y.o. adult female to female transgender, admitted yesterday through the emergency room after presenting with persistent, unrelenting abdominal pain intermittent nausea vomiting and diarrhea.  Patient has had an associated 11 to 12 pound weight loss over the past 3 weeks. Patient had recently been established with GI and had seen Hyacinth Meeker, PA-C in the office initially in April 2021. He had onset of symptoms in March 2021, had taken a course of antibiotics for UTI in February.  ER visit on 08/09/2019 with nausea vomiting and abdominal pain.  CT scan was done showed borderline wall thickening of the left and proximal sigmoid colon raising question of underlying colitis.  He was treated with a course of Augmentin. He then complained of increased diarrhea and was C. difficile positive on 09/07/2019.  He was given a course of vancomycin x10 days by his PCP. He says he really did not have any improvement of symptoms on vancomycin.  He was C. difficile positive again on 09/28/2019 again treated with vancomycin, did not complete course did not have improvement in symptoms and was then given metronidazole by his PCP.  He could not tolerate that due to nausea.  He was then given a course of Dificid on 10/13/2019 through our office.  He completed that course, and relates that he was tested again for C. difficile by his PCP, that result is not in epic but patient says was C. difficile negative. He was in the emergency room on 10/25/2019 had repeat CT scan which was negative, and labs were unremarkable.  He returned to the ER on 11/20/2019 with lower abdominal pain.  Apparently had abnormal UA, I do not see that culture was done and he was not treated with antibiotics because of the concern for C.  difficile. He has continued to struggle with symptoms, says he is having sharp pain which initially was in his lower abdomen and is now all throughout his abdomen, he has a constant unsettled sensation.  He is also been having mid to lower back pain fairly constantly over the past weeks.  He says been unable to eat much of anything because eating increases his abdominal pain will cause nausea or vomiting and/or diarrhea.  He has not noted any melena or hematochezia.  Generally having 2-3 bowel movements per day.  No fever or chills, has had intermittent sweats. He is status post cholecystectomy in 2020. Labs on admit-COVID-19 negative, WBC 6.7/hemoglobin 17 C. difficile quick screen and stool culture, and lactoferrin pending he has not had a bowel movement since admit.  Patient relates maternal grandmother with history of Crohn's and a grandfather with stomach cancer.    Past Medical History:  Diagnosis Date  . Anxiety   . Asthma   . B12 deficiency   . C. difficile diarrhea   . Female-to-female transgender person   . Gallbladder sludge   . Post concussion syndrome   . Severe depression (HCC)     Past Surgical History:  Procedure Laterality Date  . brain cyst removal    . CHOLECYSTECTOMY    . LIGAMENT REPAIR Left    radial  . thumb surgery    . ULNAR NERVE REPAIR Left 09/11/2019  . WISDOM TOOTH EXTRACTION      Prior to Admission medications   Medication Sig Start Date  End Date Taking? Authorizing Provider  albuterol (PROVENTIL HFA;VENTOLIN HFA) 108 (90 Base) MCG/ACT inhaler Inhale 2 puffs into the lungs every 6 (six) hours as needed for wheezing or shortness of breath.   Yes [provider]  amitriptyline (ELAVIL) 10 MG tablet Take 10 mg by mouth at bedtime as needed for sleep.  09/01/19  Yes [provider]  cyanocobalamin 1000 MCG tablet Take 1 tablet by mouth daily. 04/04/18  Yes [provider]  ergocalciferol (VITAMIN D2) 1.25 MG (50000 UT) capsule  Take 50,000 Units by mouth once a week.   Yes [provider]  naproxen (NAPROSYN) 375 MG tablet Take 1 tablet (375 mg total) by mouth 2 (two) times daily. Patient taking differently: Take 375 mg by mouth 2 (two) times daily as needed for moderate pain.  11/20/19  Yes Derwood KaplanNanavati, Ankit, MD  Rimegepant Sulfate (NURTEC) 75 MG TBDP Take 1 tablet by mouth daily as needed (headache).    Yes [provider]  testosterone cypionate (DEPOTESTOSTERONE CYPIONATE) 200 MG/ML injection Inject 7.75 mLs into the muscle every 14 (fourteen) days.  08/11/18  Yes [provider]  traMADol (ULTRAM) 50 MG tablet Take 1 tablet (50 mg total) by mouth every 6 (six) hours as needed. Patient taking differently: Take 50 mg by mouth every 6 (six) hours as needed for moderate pain.  10/26/19  Yes Unk LightningLemmon, Jennifer Lynne, PA  ADDERALL XR 20 MG 24 hr capsule Take 20 mg by mouth every morning. Patient not taking: Reported on 12/01/2019 10/31/19   [provider]  omeprazole (PRILOSEC) 40 MG capsule Take 40 mg by mouth 2 (two) times daily. Patient not taking: Reported on 12/01/2019 10/17/19   [provider]  ondansetron (ZOFRAN-ODT) 8 MG disintegrating tablet Take 8 mg by mouth every 8 (eight) hours as needed. Patient not taking: Reported on 12/01/2019 10/17/19   [provider]  oxyCODONE (OXY IR/ROXICODONE) 5 MG immediate release tablet Take by mouth every 4 (four) hours as needed. Patient not taking: Reported on 12/01/2019 09/11/19   [provider]  rizatriptan (MAXALT-MLT) 10 MG disintegrating tablet Take by mouth as directed. Patient not taking: Reported on 12/01/2019 09/26/19   [provider]  vancomycin (VANCOCIN) 125 MG capsule Take by mouth as directed. Patient not taking: Reported on 12/01/2019 10/01/19   [provider]    Current Facility-Administered Medications  Medication Dose Route Frequency Provider Last Rate Last Admin  . 0.9 %  sodium chloride  infusion  1,000 mL Intravenous Continuous Linwood DibblesKnapp, Jon, MD      . acetaminophen (TYLENOL) tablet 650 mg  650 mg Oral Q6H PRN Mansy, Jan A, MD       Or  . acetaminophen (TYLENOL) suppository 650 mg  650 mg Rectal Q6H PRN Mansy, Jan A, MD      . albuterol (PROVENTIL) (2.5 MG/3ML) 0.083% nebulizer solution 3 mL  3 mL Inhalation Q6H PRN Mansy, Jan A, MD      . enoxaparin (LOVENOX) injection 40 mg  40 mg Subcutaneous Q24H Mansy, Jan A, MD   40 mg at 12/01/19 2116  . loratadine (CLARITIN) tablet 10 mg  10 mg Oral Daily PRN Mansy, Jan A, MD      . morphine 2 MG/ML injection 2 mg  2 mg Intravenous Q4H PRN Mansy, Jan A, MD   2 mg at 12/02/19 0745  . ondansetron (ZOFRAN) tablet 4 mg  4 mg Oral Q6H PRN Mansy, Vernetta HoneyJan A, MD       Or  .  ondansetron (ZOFRAN) injection 4 mg  4 mg Intravenous Q6H PRN Mansy, Jan A, MD   4 mg at 12/02/19 3810  . pantoprazole (PROTONIX) EC tablet 40 mg  40 mg Oral Daily Mansy, Jan A, MD      . promethazine (PHENERGAN) suppository 25 mg  25 mg Rectal Q6H PRN Mansy, Jan A, MD      . sodium chloride flush (NS) 0.9 % injection 3 mL  3 mL Intravenous Once Linwood Dibbles, MD      . SUMAtriptan [facility]) tablet 50 mg  50 mg Oral Q2H PRN Mansy, Jan A, MD      . traMADol Janean Sark) tablet 50 mg  50 mg Oral Q6H PRN Mansy, Jan A, MD   50 mg at 12/01/19 2115  . traZODone (DESYREL) tablet 25 mg  25 mg Oral QHS PRN Mansy, Jan A, MD      . vitamin B-12 (CYANOCOBALAMIN) tablet 1,000 mcg  1,000 mcg Oral Daily Mansy, Jan A, MD      . Melene Muller ON 12/05/2019] Vitamin D (Ergocalciferol) (DRISDOL) capsule 50,000 Units  50,000 Units Oral Weekly Mansy, Jan A, MD        Allergies as of 12/01/2019 - Review Complete 12/01/2019  Allergen Reaction Noted  . Bee pollen Anaphylaxis 07/20/2018  . Mold extract [trichophyton] Anaphylaxis and Itching 03/14/2017  . Pollen extract Anaphylaxis 07/20/2018  . Zyrtec [cetirizine] Anaphylaxis 03/14/2017  . Mixed ragweed Itching 09/07/2019    Family History  Problem Relation Age  of Onset  . Seizures Maternal Aunt   . Heart disease Paternal Aunt   . Diabetes Maternal Grandmother   . Heart disease Maternal Grandmother   . Heart disease Maternal Grandfather   . Deep vein thrombosis Maternal Grandfather   . Hyperlipidemia Maternal Grandfather   . Stomach cancer Paternal Grandfather   . Colon cancer Neg Hx   . Esophageal cancer Neg Hx   . Pancreatic cancer Neg Hx     Social History   Socioeconomic History  . Marital status: Single    Spouse name: Not on file  . Number of children: Not on file  . Years of education: Not on file  . Highest education level: Not on file  Occupational History  . Not on file  Tobacco Use  . Smoking status: Never Smoker  . Smokeless tobacco: Never Used  Vaping Use  . Vaping Use: Every day  . Substances: Nicotine, THC, Flavoring  Substance and Sexual Activity  . Alcohol use: Yes    Comment: occasionally  . Drug use: Yes    Types: Marijuana  . Sexual activity: Not Currently  Other Topics Concern  . Not on file  Social History Narrative  . Not on file   Social Determinants of Health   Financial Resource Strain:   . Difficulty of Paying Living Expenses:   Food Insecurity:   . Worried About Programme researcher, broadcasting/film/video in the Last Year:   . Barista in the Last Year:   Transportation Needs:   . Freight forwarder (Medical):   Marland Kitchen Lack of Transportation (Non-Medical):   Physical Activity:   . Days of Exercise per Week:   . Minutes of Exercise per Session:   Stress:   . Feeling of Stress :   Social Connections:   . Frequency of Communication with Friends and Family:   . Frequency of Social Gatherings with Friends and Family:   . Attends Religious Services:   . Active Member of Clubs  or Organizations:   . Attends Banker Meetings:   Marland Kitchen Marital Status:   Intimate Partner Violence:   . Fear of Current or Ex-Partner:   . Emotionally Abused:   Marland Kitchen Physically Abused:   . Sexually Abused:     Review of  Systems: Pertinent positive and negative review of systems were noted in the above HPI section.  All other review of systems was otherwise negative.  Physical Exam: Vital signs in last 24 hours: Temp:  [97.3 F (36.3 C)-98.7 F (37.1 C)] 97.7 F (36.5 C) (07/24 0815) Pulse Rate:  [48-58] 48 (07/24 0815) Resp:  [16-18] 16 (07/24 0815) BP: (107-137)/(65-99) 114/73 (07/24 0815) SpO2:  [98 %-100 %] 99 % (07/24 0815) Weight:  [99.5 kg] 99.5 kg (07/23 2017)   General:   Alert,  Well-developed, well-nourished, transgender female pleasant and cooperative in NAD Head:  Normocephalic and atraumatic. Eyes:  Sclera clear, no icterus.   Conjunctiva pink. Ears:  Normal auditory acuity. Nose:  No deformity, discharge,  or lesions. Mouth:  No deformity or lesions.   Neck:  Supple; no masses or thyromegaly. Lungs:  Clear throughout to auscultation.   No wheezes, crackles, or rhonchi. Heart:  Regular rate and rhythm; no murmurs, clicks, rubs,  or gallops. Abdomen:  Soft, rather diffuse tenderness, no guarding or rebound, BS active,nonpalp mass or hsm.   Rectal:  Deferred  Msk:  Symmetrical without gross deformities. . Pulses:  Normal pulses noted. Extremities:  Without clubbing or edema. Neurologic:  Alert and  oriented x4;  grossly normal neurologically. Skin:  Intact without significant lesions or rashes.. Psych:  Alert and cooperative. Normal mood and affect.  Intake/Output from previous day: No intake/output data recorded. Intake/Output this shift: No intake/output data recorded.  Lab Results: Recent Labs    12/01/19 1325 12/02/19 0644  WBC 6.7 5.4  HGB 17.0* 14.7  HCT 50.0* 43.3  PLT 203 179   BMET Recent Labs    12/01/19 1325 12/02/19 0644  NA 140 138  K 3.7 3.7  CL 103 105  CO2 26 24  GLUCOSE 92 87  BUN 12 10  CREATININE 0.90 0.77  CALCIUM 9.4 8.7*   LFT Recent Labs    12/01/19 1325  PROT 8.0  ALBUMIN 4.7  AST 19  ALT 18  ALKPHOS 49  BILITOT 1.0   PT/INR No  results for input(s): LABPROT, INR in the last 72 hours. Hepatitis Panel No results for input(s): HEPBSAG, HCVAB, HEPAIGM, HEPBIGM in the last 72 hours.      IMPRESSION:  #15 19 year old female to female transgender individual admitted with 28-month history of symptoms with abdominal pain, cramping intermittent nausea and vomiting and diarrhea. Patient was diagnosed with C. difficile 09/07/2019.  Completed almost 2 courses of vancomycin, could not tolerate metronidazole and then did complete a course of Dificid which was finished mid June.  Apparently C. difficile returned negative at that time. Per patient he never had any significant improvement in symptoms with any of the antibiotics, and presents now with persistent symptoms and associated weight loss.  Etiology is not clear-rule out relapsing C. difficile, rule out postinfectious IBS, rule out underlying IBD.  #2 history of asthma 3.  Status post cholecystectomy 2020 4.  Migraine headaches 5.  ADD #6 depression  Plan; clear liquid diet, advance to full liquids as tolerated Await stool culture, C. difficile and lactoferrin Add sed rate and CRP Advised patient to use Zofran on a regular basis for nausea, and hopes he  can keep down some p.o.'s. We will add Bentyl 10 mg p.o. 3 times daily If infectious work-up is negative, will plan to prep and proceed with endoscopic evaluation with colonoscopy and EGD-likely Monday.  Amy EsterwoodPA-C  12/02/2019, 9:01 AM   GI ATTENDING  History, laboratories, ancillary data reviewed.  Patient known peripherally from supervising role in outpatient clinic.  Agree with comprehensive consultation note as outlined above.  Labs unremarkable.  CT raise question of possible mild thickening in the left colon.  Agree with assessment for infectious etiologies and if negative colonoscopy with biopsies and upper endoscopy with biopsies to assess for various causes of nausea, vomiting, abdominal pain, diarrhea.  In  addition to other empiric therapies, if work-up negative, could consider colestipol for possible bile salt related diarrhea in a patient postcholecystectomy.  Wilhemina Bonito. Eda Keys., M.D. [facility] Division of Gastroenterology

## 2019-12-03 DIAGNOSIS — R1030 Lower abdominal pain, unspecified: Secondary | ICD-10-CM

## 2019-12-03 DIAGNOSIS — K591 Functional diarrhea: Secondary | ICD-10-CM

## 2019-12-03 LAB — CBC WITH DIFFERENTIAL/PLATELET
Abs Immature Granulocytes: 0.01 10*3/uL (ref 0.00–0.07)
Basophils Absolute: 0 10*3/uL (ref 0.0–0.1)
Basophils Relative: 0 %
Eosinophils Absolute: 0.1 10*3/uL (ref 0.0–0.5)
Eosinophils Relative: 3 %
HCT: 45.3 % (ref 36.0–46.0)
Hemoglobin: 14.7 g/dL (ref 12.0–15.0)
Immature Granulocytes: 0 %
Lymphocytes Relative: 48 %
Lymphs Abs: 2.1 10*3/uL (ref 0.7–4.0)
MCH: 28.4 pg (ref 26.0–34.0)
MCHC: 32.5 g/dL (ref 30.0–36.0)
MCV: 87.5 fL (ref 80.0–100.0)
Monocytes Absolute: 0.4 10*3/uL (ref 0.1–1.0)
Monocytes Relative: 8 %
Neutro Abs: 1.8 10*3/uL (ref 1.7–7.7)
Neutrophils Relative %: 41 %
Platelets: 174 10*3/uL (ref 150–400)
RBC: 5.18 MIL/uL — ABNORMAL HIGH (ref 3.87–5.11)
RDW: 12.4 % (ref 11.5–15.5)
WBC: 4.5 10*3/uL (ref 4.0–10.5)
nRBC: 0 % (ref 0.0–0.2)

## 2019-12-03 LAB — BASIC METABOLIC PANEL
Anion gap: 9 (ref 5–15)
BUN: 6 mg/dL (ref 6–20)
CO2: 23 mmol/L (ref 22–32)
Calcium: 8.8 mg/dL — ABNORMAL LOW (ref 8.9–10.3)
Chloride: 107 mmol/L (ref 98–111)
Creatinine, Ser: 0.84 mg/dL (ref 0.44–1.00)
GFR calc Af Amer: >60 mL/min (ref >60)
GFR calc non Af Amer: >60 mL/min (ref >60)
Glucose, Bld: 82 mg/dL (ref 70–99)
Potassium: 3.7 mmol/L (ref 3.5–5.1)
Sodium: 139 mmol/L (ref 135–145)

## 2019-12-03 LAB — C DIFFICILE QUICK SCREEN W PCR REFLEX
C Diff antigen: NEGATIVE
C Diff interpretation: NOT DETECTED
C Diff toxin: NEGATIVE

## 2019-12-03 LAB — MAGNESIUM: Magnesium: 1.9 mg/dL (ref 1.7–2.4)

## 2019-12-03 MED ORDER — PEG-KCL-NACL-NASULF-NA ASC-C 100 G PO SOLR
0.5000 | Freq: Once | ORAL | Status: AC
  Administered 2019-12-03: 100 g via ORAL
  Filled 2019-12-03: qty 1

## 2019-12-03 MED ORDER — ADULT MULTIVITAMIN W/MINERALS CH
1.0000 | ORAL_TABLET | Freq: Every day | ORAL | Status: DC
  Administered 2019-12-03 – 2019-12-05 (×3): 1 via ORAL
  Filled 2019-12-03 (×3): qty 1

## 2019-12-03 MED ORDER — ENOXAPARIN SODIUM 40 MG/0.4ML ~~LOC~~ SOLN: 40.0000 mg | SUBCUTANEOUS | Status: DC

## 2019-12-03 MED ORDER — ONDANSETRON HCL 4 MG PO TABS
4.0000 mg | ORAL_TABLET | Freq: Four times a day (QID) | ORAL | Status: DC
  Administered 2019-12-04: 4 mg via ORAL
  Filled 2019-12-03: qty 1

## 2019-12-03 MED ORDER — PEG-KCL-NACL-NASULF-NA ASC-C 100 G PO SOLR
0.5000 | Freq: Once | ORAL | Status: AC
  Administered 2019-12-03: 100 g via ORAL

## 2019-12-03 MED ORDER — ONDANSETRON HCL 4 MG/2ML IJ SOLN
4.0000 mg | Freq: Four times a day (QID) | INTRAMUSCULAR | Status: DC
  Administered 2019-12-03 – 2019-12-05 (×8): 4 mg via INTRAVENOUS
  Filled 2019-12-03 (×8): qty 2

## 2019-12-03 MED ORDER — BOOST / RESOURCE BREEZE PO LIQD CUSTOM
1.0000 | Freq: Three times a day (TID) | ORAL | Status: DC
  Administered 2019-12-03 – 2019-12-05 (×6): 1 via ORAL

## 2019-12-03 MED ORDER — PEG-KCL-NACL-NASULF-NA ASC-C 100 G PO SOLR: 1.0000 | Freq: Once | ORAL | Status: DC

## 2019-12-03 NOTE — Progress Notes (Addendum)
Patient ID: [name], adult   DOB: 02/08/2001, 19 y.o.   MRN: [id number]    Progress Note   Subjective   Day # 3 CC; diarrhea, abd pain, n/V   sed rate 2, CRP<0.5  Cdiff/stool culture/lactoferrin all Pend as patient has not had any bowel movement since admit  Patient continues to complain of mid abdominal pain, remains nauseated and says he is intermittently vomiting despite use of Zofran   Objective   Vital signs in last 24 hours: Temp:  [97.3 F (36.3 C)-98.3 F (36.8 C)] 97.8 F (36.6 C) (07/25 0539) Pulse Rate:  [44-51] 44 (07/25 0539) Resp:  [16-18] 16 (07/25 0539) BP: (101-117)/(66-68) 101/66 (07/25 0539) SpO2:  [97 %-99 %] 97 % (07/25 0539) Last BM Date: 12/01/19 General:   Young transgender female in NAD affect fairly flat Heart:  Regular rate and rhythm; no murmurs Lungs: Respirations even and unlabored, lungs CTA bilaterally Abdomen:  Soft, mild rather generalized tenderness, no guarding nondistended. Normal bowel sounds. Extremities:  Without edema. Neurologic:  Alert and oriented,  grossly normal neurologically. Psych:  Cooperative. Normal mood and affect.  Intake/Output from previous day: No intake/output data recorded. Intake/Output this shift: No intake/output data recorded.  Lab Results: Recent Labs    12/01/19 1325 12/02/19 0644 12/03/19 0713  WBC 6.7 5.4 4.5  HGB 17.0* 14.7 14.7  HCT 50.0* 43.3 45.3  PLT 203 179 174   BMET Recent Labs    12/01/19 1325 12/02/19 0644 12/03/19 0713  NA 140 138 139  K 3.7 3.7 3.7  CL 103 105 107  CO2 26 24 23  GLUCOSE 92 87 82  BUN 12 10 6  CREATININE 0.90 0.77 0.84  CALCIUM 9.4 8.7* 8.8*   LFT Recent Labs    12/01/19 1325  PROT 8.0  ALBUMIN 4.7  AST 19  ALT 18  ALKPHOS 49  BILITOT 1.0   PT/INR No results for input(s): LABPROT, INR in the last 72 hours.  Studies/Results: No results found.     Assessment / Plan:    #1 19-year-old female to female transgender individual admitted with  4-month history of abdominal pain, cramping, intermittent nausea and vomiting and diarrhea.  Patient was diagnosed with C. difficile on 09/07/2019, completed almost 2 courses of vancomycin, then switched to metronidazole which she could not tolerate and then took a course of Dificid which was finished in mid June. Apparently repeat C. difficile returned negative at that time, result not in epic. Patient relates no significant improvement in symptoms with any of the courses of antibiotics, and presents now with persistent complaints of ongoing abdominal pain, diarrhea without bleeding nausea and vomiting.  He was scheduled for outpatient colonoscopy and EGD, but was advised to come to the emergency room because of ongoing refractory symptoms.  Interestingly he is not had any bowel movement since admission, therefore highly doubt C. difficile or other infectious etiology.  Sed rate and CRP are normal making IBD less likely. 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  He needs to have colonoscopy and EGD to rule out IBD.  #2 status post cholecystectomy 2020 #3 migraines #4 ADD #5 depression  Plan; continue clear liquid diet today Change Zofran to around-the-clock Continue Bentyl 3 times daily Will plan to proceed with colonoscopy and EGD which will likely be tomorrow afternoon.  We will prep this evening.  Patient is not sure that he is going to be able to keep the prep down but willing   to try.   Active Problems:   Intractable nausea and vomiting   Diarrhea   Lower abdominal pain   Nausea and vomiting   Attention deficit disorder   Gastroesophageal reflux disease   Anemia due to vitamin B12 deficiency     LOS: 2 days   Amy EsterwoodPA-C  12/03/2019, 8:39 AM   GI ATTENDING  Interval history data reviewed.  Patient personally seen and examined.  Patient sitting comfortably in bed.  Has had no diarrhea since admission.  Abdominal exam is  benign.  Plans for colonoscopy and upper endoscopy to evaluate chronic pain, complaints of nausea vomiting and diarrhea.  As well, clarify very subtle questionable abnormalities of left colon on CT.The nature of the procedure, as well as the risks, benefits, and alternatives were carefully and thoroughly reviewed with the patient. Ample time for discussion and questions allowed. The patient understood, was satisfied, and agreed to proceed.  Wilhemina Bonito. Eda Keys., M.D. [facility] Division of Gastroenterology

## 2019-12-03 NOTE — Progress Notes (Signed)
Initial Nutrition Assessment  DOCUMENTATION CODES:   Obesity unspecified  INTERVENTION:  Boost Breeze po TID, each supplement provides 250 kcal and 9 grams of protein  MVI daily  NUTRITION DIAGNOSIS:   Inadequate oral intake related to vomiting, nausea as evidenced by per patient/family report.    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  Differential diagnosis would include acute gastroenteritis. PMH includes asthma, depression/anxiety, migraines, ADD, recent C. Diff colitis which was treated as an outpatient.  Pt's preferred pronouns are he/him.   Pt states that he has been experiencing recurrent and worsening N/V/D for the last 3 weeks. Initially, he was able to tolerate bland carbohydrates, such as rice and toast, and 1 protein drink per day (pt unable to recall brand). Over the last week, especially the last 3-4 days, pt states he has not been able to keep anything down. When asked about last vomiting episode, pt reports he vomited about 20 minutes after drinking cranberry juice last night.  Pt does report associated wt loss. Per wt readings, pt noted to have weighed 104.4 kg on 10/13/19. Pt now weighs 99.5 kg. This indicates a 4.69% wt loss x1 month, which is insignificant for time frame.   Discussed plan of care with pt. RD will order Boost Breeze TID with the understanding that pt may initially refuse these supplements due to inability to keep food/drinks down. However, pt has agreed to drink Boost Breeze once he is able to tolerate drinks. As pt's diet is advanced, RD will order more nutritionally-dense oral nutrition supplements. Pt agreeable to  plan.  No PO intake documented.   Labs reviewed. Medications: Protonix, Vitamin B12, Vitamin D IVF: NS @ 188mL/hr  NUTRITION - FOCUSED PHYSICAL EXAM:  Unable to perform at this time. Will attempt at follow-up.   Diet Order:   Diet Order            Diet clear liquid Room service appropriate? Yes; Fluid consistency: Thin  Diet effective now                 EDUCATION NEEDS:   No education needs have been identified at this time  Skin:  Skin Assessment: Reviewed RN Assessment  Last BM:  7/23  Height:   Ht Readings from Last 1 Encounters:  11/20/19 5\' 9"  (1.753 m)    Weight:   Wt Readings from Last 1 Encounters:  12/01/19 (!) 99.5 kg    Ideal Body Weight:  65.91 kg  BMI:  Body mass index is 32.39 kg/m.  Estimated Nutritional Needs:   Kcal:  2100-2300  Protein:  120-130 grams  Fluid:  >/=2.1L/d    12/03/19, MS, RD, LDN RD pager number and weekend/on-call pager number located in [place].

## 2019-12-03 NOTE — Progress Notes (Signed)
PROGRESS NOTE    [name]  TUU:[id number] DOB: 03/14/2001 DOA: 12/01/2019 PCP: Cynthia Knapp, MD    Chief Complaint  Patient presents with  . Abdominal Pain  . Nausea    Brief Narrative:  HPI per Dr. [name]  is a 19 y.o. African-American transgender adult  who identifies himself as a female with a known history of asthma, and anxiety, migraine, ADD, depression C. difficile colitis for which he was treated on an outpatient basis and tested -3 weeks ago after completion of Dificid course, who presented to the emergency room with an onset of recurrent nausea vomiting with associated diarrhea which have been worsening for the last 3 weeks with no fever or chills.  No bloody diarrhea or melena.  No coffee-ground emesis or bloody vomitus.  He has been having lower abdominal pain initiated now we can generalized.  Note dyspnea or cough or wheezing.  Is been having occasional chest pain with no palpitations.  No dysuria, oliguria or hematuria or flank pain  Upon presentation to the emergency room, blood pressure was 134/99 with otherwise normal vital signs.  Labs revealed unremarkable CMP with negative lipase of 24 and CBC showing hemoconcentration.  Urinalysis showed 11-20 WBCs with positive hyaline casts and few bacteria and large leukocytes.  The patient was given 40 mg of IV Toradol, 4 mg of IV Zofran and 1 L bolus of IV normal saline of 125 mL/h.  He will be admitted to a medical monitored bed for further evaluation and management.   Assessment & Plan:   Active Problems:   Intractable nausea and vomiting   Diarrhea   Lower abdominal pain   Nausea and vomiting   Attention deficit disorder   Gastroesophageal reflux disease   Anemia due to vitamin B12 deficiency  1 intractable nausea and vomiting with diarrhea and abdominal pain concerning for recurrent C. difficile colitis Patient presented with nausea vomiting diarrhea and some abdominal pain.  Patient treated in the  outpatient setting for C. difficile colitis and per patient with repeat C. difficile negative.  Patient presenting back with recurrent nausea vomiting abdominal pain and diarrhea.  Patient admitted.  Stool studies pending.  CRP < 0.5.  Patient with no diarrhea since admission.  GI consulted and patient seen in consultation by Dr.[name] and patient placed on a clear liquid diet with advancement to full liquid diet as tolerated.  GI recommending awaiting stool studies. Per GI if infectious work-up is negative will proceed with endoscopic evaluation of upper endoscopy and colonoscopy and also consideration of starting patient on colestipol for possible bile salt related diarrhea inpatient post cholecystectomy. Continue Bentyl 3 times daily.  GI has changed patient's Zofran to scheduled around-the-clock.  Patient for probable colonoscopy and EGD tomorrow per GI. Appreciate GI input and recommendations.  2.  GERD Continue PPI.  3.  ADD Continue Adderall XR.  4.  Vitamin B12 deficiency Vitamin B12 supplementation.  5.  Migraine headaches Stable.    DVT prophylaxis: Lovenox Code Status: Full Family Communication: Updated patient.  No family at bedside. Disposition:   Status is: Inpatient    Dispo: The patient is from: Home              Anticipated d/c is to: Home              Anticipated d/c date is: 2 to 3 days.              Patient currently being evaluated by GI for  ongoing diarrhea, abdominal pain, nausea.  Will likely need scopes prior to discharge.       Consultants:   Gastroenterology: Dr. [name] 12/02/2019  Procedures:   None  Antimicrobials:   None   Subjective: Patient sitting up in bed on his telephone.  Denies chest pain or shortness of breath.  Does endorse a bout of emesis.  No bowel movement since admission.  Complaining of nausea.  Also complaining of abdominal pain.   Objective: Vitals:   12/02/19 0815 12/02/19 1309 12/02/19 2213 12/03/19 0539  BP: 114/73  117/68 113/66 101/66  Pulse: 48 51 (!) 44 (!) 44  Resp: 16 16 18 16   Temp: 97.7 F (36.5 C) (!) 97.3 F (36.3 C) 98.3 F (36.8 C) 97.8 F (36.6 C)  TempSrc: Oral Oral Oral   SpO2: 99% 99% 98% 97%  Weight:       No intake or output data in the 24 hours ending 12/03/19 1247 Filed Weights   12/01/19 2017  Weight: (!) 99.5 kg    Examination:  General exam: NAD Respiratory system: CTAB.  No wheezes, no crackles, no rhonchi.  Normal respiratory effort.  Cardiovascular system: Regular rate rhythm no murmurs rubs or gallops.  No JVD.  No lower extremity edema.  Gastrointestinal system: Abdomen is soft, nondistended, positive bowel sounds, diffuse tenderness to palpation.  No rebound.  No guarding.  Central nervous system: Alert and oriented. No focal neurological deficits. Extremities: Symmetric 5 x 5 power. Skin: No rashes, lesions or ulcers Psychiatry: Judgement and insight appear normal. Mood & affect appropriate.     Data Reviewed: I have personally reviewed following labs and imaging studies  CBC: Recent Labs  Lab 12/01/19 1325 12/02/19 0644 12/03/19 0713  WBC 6.7 5.4 4.5  NEUTROABS  --   --  1.8  HGB 17.0* 14.7 14.7  HCT 50.0* 43.3 45.3  MCV 85.5 85.1 87.5  PLT 203 179 325    Basic Metabolic Panel: Recent Labs  Lab 12/01/19 1325 12/02/19 0644 12/02/19 0919 12/03/19 0713  NA 140 138  --  139  K 3.7 3.7  --  3.7  CL 103 105  --  107  CO2 26 24  --  23  GLUCOSE 92 87  --  82  BUN 12 10  --  6  CREATININE 0.90 0.77  --  0.84  CALCIUM 9.4 8.7*  --  8.8*  MG  --   --  1.9 1.9    GFR: Estimated Creatinine Clearance (by C-G formula based on SCr of 0.84 mg/dL) Female: 133 mL/min Female: 161.7 mL/min  Liver Function Tests: Recent Labs  Lab 12/01/19 1325  AST 19  ALT 18  ALKPHOS 49  BILITOT 1.0  PROT 8.0  ALBUMIN 4.7    CBG: No results for input(s): GLUCAP in the last 168 hours.   Recent Results (from the past 240 hour(s))  SARS Coronavirus 2 by  RT PCR (hospital order, performed in [facility] hospital lab) Nasopharyngeal Nasopharyngeal Swab     Status: None   Collection Time: 12/01/19  7:34 PM   Specimen: Nasopharyngeal Swab  Result Value Ref Range Status   SARS Coronavirus 2 NEGATIVE NEGATIVE Final    Comment: (NOTE) SARS-CoV-2 target nucleic acids are NOT DETECTED.  The SARS-CoV-2 RNA is generally detectable in upper and lower respiratory specimens during the acute phase of infection. The lowest concentration of SARS-CoV-2 viral copies this assay can detect is 250 copies / mL. A negative result does not preclude  SARS-CoV-2 infection and should not be used as the sole basis for treatment or other patient management decisions.  A negative result may occur with improper specimen collection / handling, submission of specimen other than nasopharyngeal swab, presence of viral mutation(s) within the areas targeted by this assay, and inadequate number of viral copies (<250 copies / mL). A negative result must be combined with clinical observations, patient history, and epidemiological information.  Fact Sheet for Patients:   StrictlyIdeas.no  Fact Sheet for Healthcare Providers: BankingDealers.co.za  This test is not yet approved or  cleared by the [place] FDA and has been authorized for detection and/or diagnosis of SARS-CoV-2 by FDA under an Emergency Use Authorization (EUA).  This EUA will remain in effect (meaning this test can be used) for the duration of the COVID-19 declaration under Section 564(b)(1) of the Act, 21 U.S.C. section 360bbb-3(b)(1), unless the authorization is terminated or revoked sooner.  Performed at [facility], [address], [place], [place] [address]          Radiology Studies: No results found.      Scheduled Meds: . dicyclomine  10 mg Oral TID AC  . [START ON 12/04/2019] enoxaparin (LOVENOX) injection  40 mg  Subcutaneous Q24H  . feeding supplement  1 Container Oral TID BM  . multivitamin with minerals  1 tablet Oral Daily  . ondansetron (ZOFRAN) IV  4 mg Intravenous Q6H   Or  . ondansetron  4 mg Oral Q6H  . pantoprazole  40 mg Oral Daily  . peg 3350 powder  0.5 kit Oral Once   And  . peg 3350 powder  0.5 kit Oral Once  . sodium chloride flush  3 mL Intravenous Once  . cyanocobalamin  1,000 mcg Oral Daily  . [START ON 12/05/2019] Vitamin D (Ergocalciferol)  50,000 Units Oral Weekly   Continuous Infusions: . sodium chloride 1,000 mL (12/02/19 1855)     LOS: 2 days    Time spent: 35 minutes    Irine Seal, MD [facility]   To contact the attending provider between 7A-7P or the covering provider during after hours 7P-7A, please log into the web site [email or web address] and access using universal [place] password for that web site. If you do not have the password, please call the hospital operator.  12/03/2019, 12:47 PM

## 2019-12-03 NOTE — H&P (View-Only) (Signed)
Patient ID: [name], adult   DOB: [birth year], 19 y.o.   MRN: [id number]    Progress Note   Subjective   Day # 3 CC; diarrhea, abd pain, n/V   sed rate 2, CRP<0.5  Cdiff/stool culture/lactoferrin all Pend as patient has not had any bowel movement since admit  Patient continues to complain of mid abdominal pain, remains nauseated and says he is intermittently vomiting despite use of Zofran   Objective   Vital signs in last 24 hours: Temp:  [97.3 F (36.3 C)-98.3 F (36.8 C)] 97.8 F (36.6 C) (07/25 0539) Pulse Rate:  [44-51] 44 (07/25 0539) Resp:  [16-18] 16 (07/25 0539) BP: (101-117)/(66-68) 101/66 (07/25 0539) SpO2:  [97 %-99 %] 97 % (07/25 0539) Last BM Date: 12/01/19 General:   Young transgender female in NAD affect fairly flat Heart:  Regular rate and rhythm; no murmurs Lungs: Respirations even and unlabored, lungs CTA bilaterally Abdomen:  Soft, mild rather generalized tenderness, no guarding nondistended. Normal bowel sounds. Extremities:  Without edema. Neurologic:  Alert and oriented,  grossly normal neurologically. Psych:  Cooperative. Normal mood and affect.  Intake/Output from previous day: No intake/output data recorded. Intake/Output this shift: No intake/output data recorded.  Lab Results: Recent Labs    12/01/19 1325 12/02/19 0644 12/03/19 0713  WBC 6.7 5.4 4.5  HGB 17.0* 14.7 14.7  HCT 50.0* 43.3 45.3  PLT 203 179 174   BMET Recent Labs    12/01/19 1325 12/02/19 0644 12/03/19 0713  NA 140 138 139  K 3.7 3.7 3.7  CL 103 105 107  CO2 26 24 23   GLUCOSE 92 87 82  BUN 12 10 6   CREATININE 0.90 0.77 0.84  CALCIUM 9.4 8.7* 8.8*   LFT Recent Labs    12/01/19 1325  PROT 8.0  ALBUMIN 4.7  AST 19  ALT 18  ALKPHOS 49  BILITOT 1.0   PT/INR No results for input(s): LABPROT, INR in the last 72 hours.  Studies/Results: No results found.     Assessment / Plan:    #65 19 year old female to female transgender individual admitted with  19-month history of abdominal pain, cramping, intermittent nausea and vomiting and diarrhea.  Patient was diagnosed with C. difficile on 09/07/2019, completed almost 2 courses of vancomycin, then switched to metronidazole which she could not tolerate and then took a course of Dificid which was finished in mid June. Apparently repeat C. difficile returned negative at that time, result not in epic. Patient relates no significant improvement in symptoms with any of the courses of antibiotics, and presents now with persistent complaints of ongoing abdominal pain, diarrhea without bleeding nausea and vomiting.  He was scheduled for outpatient colonoscopy and EGD, but was advised to come to the emergency room because of ongoing refractory symptoms.  Interestingly he is not had any bowel movement since admission, therefore highly doubt C. difficile or other infectious etiology.  Sed rate and CRP are normal making IBD less likely. 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  He needs to have colonoscopy and EGD to rule out IBD.  #2 status post cholecystectomy 2020 #3 migraines #4 ADD #5 depression  Plan; continue clear liquid diet today Change Zofran to around-the-clock Continue Bentyl 3 times daily Will plan to proceed with colonoscopy and EGD which will likely be tomorrow afternoon.  We will prep this evening.  Patient is not sure that he is going to be able to keep the prep down but willing  to try.   Active Problems:   Intractable nausea and vomiting   Diarrhea   Lower abdominal pain   Nausea and vomiting   Attention deficit disorder   Gastroesophageal reflux disease   Anemia due to vitamin B12 deficiency     LOS: 2 days   Amy EsterwoodPA-C  12/03/2019, 8:39 AM   GI ATTENDING  Interval history data reviewed.  Patient personally seen and examined.  Patient sitting comfortably in bed.  Has had no diarrhea since admission.  Abdominal exam is  benign.  Plans for colonoscopy and upper endoscopy to evaluate chronic pain, complaints of nausea vomiting and diarrhea.  As well, clarify very subtle questionable abnormalities of left colon on CT.The nature of the procedure, as well as the risks, benefits, and alternatives were carefully and thoroughly reviewed with the patient. Ample time for discussion and questions allowed. The patient understood, was satisfied, and agreed to proceed.  Wilhemina Bonito. Eda Keys., M.D. [facility] Division of Gastroenterology

## 2019-12-04 ENCOUNTER — Inpatient Hospital Stay (HOSPITAL_COMMUNITY): Payer: BC Managed Care – PPO | Admitting: Anesthesiology

## 2019-12-04 ENCOUNTER — Encounter (HOSPITAL_COMMUNITY)
Admission: EM | Disposition: A | Discharge status: Home / Self Care | Payer: Self-pay | Source: Home / Self Care | Attending: Internal Medicine

## 2019-12-04 ENCOUNTER — Encounter (HOSPITAL_COMMUNITY): Payer: Self-pay | Admitting: Family Medicine

## 2019-12-04 DIAGNOSIS — R1084 Generalized abdominal pain: Secondary | ICD-10-CM

## 2019-12-04 HISTORY — PX: ESOPHAGOGASTRODUODENOSCOPY (EGD) WITH PROPOFOL: SHX5813

## 2019-12-04 HISTORY — PX: BIOPSY: SHX5522

## 2019-12-04 HISTORY — PX: COLONOSCOPY WITH PROPOFOL: SHX5780

## 2019-12-04 LAB — CBC WITH DIFFERENTIAL/PLATELET
Abs Immature Granulocytes: 0.01 10*3/uL (ref 0.00–0.07)
Basophils Absolute: 0 10*3/uL (ref 0.0–0.1)
Basophils Relative: 1 %
Eosinophils Absolute: 0.1 10*3/uL (ref 0.0–0.5)
Eosinophils Relative: 2 %
HCT: 46 % (ref 36.0–46.0)
Hemoglobin: 15.4 g/dL — ABNORMAL HIGH (ref 12.0–15.0)
Immature Granulocytes: 0 %
Lymphocytes Relative: 42 %
Lymphs Abs: 2.3 10*3/uL (ref 0.7–4.0)
MCH: 28.8 pg (ref 26.0–34.0)
MCHC: 33.5 g/dL (ref 30.0–36.0)
MCV: 86.1 fL (ref 80.0–100.0)
Monocytes Absolute: 0.5 10*3/uL (ref 0.1–1.0)
Monocytes Relative: 10 %
Neutro Abs: 2.4 10*3/uL (ref 1.7–7.7)
Neutrophils Relative %: 45 %
Platelets: 184 10*3/uL (ref 150–400)
RBC: 5.34 MIL/uL — ABNORMAL HIGH (ref 3.87–5.11)
RDW: 12.5 % (ref 11.5–15.5)
WBC: 5.3 10*3/uL (ref 4.0–10.5)
nRBC: 0 % (ref 0.0–0.2)

## 2019-12-04 LAB — BASIC METABOLIC PANEL
Anion gap: 8 (ref 5–15)
BUN: <5 mg/dL — ABNORMAL LOW (ref 6–20)
CO2: 23 mmol/L (ref 22–32)
Calcium: 8.5 mg/dL — ABNORMAL LOW (ref 8.9–10.3)
Chloride: 108 mmol/L (ref 98–111)
Creatinine, Ser: 0.91 mg/dL (ref 0.44–1.00)
GFR calc Af Amer: >60 mL/min (ref >60)
GFR calc non Af Amer: >60 mL/min (ref >60)
Glucose, Bld: 92 mg/dL (ref 70–99)
Potassium: 3.7 mmol/L (ref 3.5–5.1)
Sodium: 139 mmol/L (ref 135–145)

## 2019-12-04 LAB — LACTOFERRIN, FECAL, QUALITATIVE: Lactoferrin, Fecal, Qual: NEGATIVE

## 2019-12-04 SURGERY — COLONOSCOPY WITH PROPOFOL
Anesthesia: Monitor Anesthesia Care

## 2019-12-04 MED ORDER — PROPOFOL 500 MG/50ML IV EMUL
INTRAVENOUS | Status: DC | PRN
  Administered 2019-12-04: 40 mg via INTRAVENOUS

## 2019-12-04 MED ORDER — PROPOFOL 1000 MG/100ML IV EMUL
INTRAVENOUS | Status: AC
  Filled 2019-12-04: qty 100

## 2019-12-04 MED ORDER — LIDOCAINE HCL (CARDIAC) PF 100 MG/5ML IV SOSY
PREFILLED_SYRINGE | INTRAVENOUS | Status: DC | PRN
  Administered 2019-12-04: 40 mg via INTRATRACHEAL

## 2019-12-04 MED ORDER — PROPOFOL 500 MG/50ML IV EMUL
INTRAVENOUS | Status: DC | PRN
  Administered 2019-12-04: 125 ug/kg/min via INTRAVENOUS

## 2019-12-04 MED ORDER — LACTATED RINGERS IV SOLN
INTRAVENOUS | Status: DC
  Administered 2019-12-04: 1000 mL via INTRAVENOUS

## 2019-12-04 SURGICAL SUPPLY — 25 items

## 2019-12-04 NOTE — Op Note (Signed)
[facility] Patient Name: [name] Procedure Date: 12/04/2019 MRN: [id number] Attending MD: Gerrit Heck , MD Date of Birth: [birth year] CSN: [id number] Age: 19 Admit Type: Inpatient Procedure:                Upper GI endoscopy Indications:              Generalized abdominal pain, Diarrhea, Nausea with                            vomiting                           4 month history of GI symptoms. Was diagnosed with                            C diff infection in 08/2019, treated with                            Vancomycin, then Flagyl then Dificid, with negative                            C diff testing in June and again on this admission,                            now presenting with ongoing GI symptoms. CT in June                            without acute intraabdominal process. Normal WBC,                            ESR, CRP, fecal lactoferrin. CCY in summer 2020. Providers:                Gerrit Heck, MD, Benetta Spar RN, RN, Cherylynn Ridges, Technician, Rosario Adie, CRNA Referring MD:              Medicines:                Monitored Anesthesia Care Complications:            No immediate complications. Estimated Blood Loss:     Estimated blood loss: none. Estimated blood loss                            was minimal. Procedure:                Pre-Anesthesia Assessment:                           - Prior to the procedure, a History and Physical                            was performed, and patient medications and  allergies were reviewed. The patient's tolerance of                            previous anesthesia was also reviewed. The risks                            and benefits of the procedure and the sedation                            options and risks were discussed with the patient.                            All questions were answered, and informed consent                            was obtained.  Prior Anticoagulants: The patient has                            taken no previous anticoagulant or antiplatelet                            agents. ASA Grade Assessment: II - A patient with                            mild systemic disease. After reviewing the risks                            and benefits, the patient was deemed in                            satisfactory condition to undergo the procedure.                           After obtaining informed consent, the endoscope was                            passed under direct vision. Throughout the                            procedure, the patient's blood pressure, pulse, and                            oxygen saturations were monitored continuously. The                            [id number]-H190 ([id number]) Olympus gastroscope was                            introduced through the mouth, and advanced to the                            second part of duodenum. The upper GI endoscopy was  accomplished without difficulty. The patient                            tolerated the procedure well. Scope In: Scope Out: Findings:      The examined esophagus was normal.      The entire examined stomach was normal. Biopsies were taken with a cold       forceps for Helicobacter pylori testing. Estimated blood loss was       minimal.      The duodenal bulb, first portion of the duodenum and second portion of       the duodenum were normal. Biopsies for histology were taken with a cold       forceps for evaluation of celiac disease. Estimated blood loss was       minimal. Impression:               - Normal esophagus.                           - Normal stomach. Biopsied.                           - Normal duodenal bulb, first portion of the                            duodenum and second portion of the duodenum.                            Biopsied. Moderate Sedation:      Not Applicable - Patient had care per Anesthesia. Recommendation:            - Perform a colonoscopy today.                           - Await pathology results.                           - Continue present medications. Procedure Code(s):        --- Professional ---                           [phone], Esophagogastroduodenoscopy, flexible,                            transoral; with biopsy, single or multiple Diagnosis Code(s):        --- Professional ---                           R10.84, Generalized abdominal pain                           R19.7, Diarrhea, unspecified                           R11.2, Nausea with vomiting, unspecified CPT copyright 2019 American Medical Association. All rights reserved. The codes documented in this report are preliminary and upon coder review may  be revised to meet current compliance requirements. Gerrit Heck, MD 12/04/2019 2:27:59 PM Number of Addenda: 0

## 2019-12-04 NOTE — Anesthesia Preprocedure Evaluation (Addendum)
Anesthesia Evaluation  Patient identified by MRN, date of birth, ID band Patient awake    Reviewed: Allergy & Precautions, NPO status , Patient's Chart, lab work & pertinent test results  History of Anesthesia Complications Negative for: history of anesthetic complications  Airway Mallampati: I  TM Distance: >3 FB Neck ROM: Full    Dental  (+) Dental Advisory Given   Pulmonary asthma (no inhaler needed in a few weeks) , Patient abstained from smoking.,    breath sounds clear to auscultation       Cardiovascular (-) hypertension(-) anginanegative cardio ROS   Rhythm:Regular Rate:Normal     Neuro/Psych Anxiety Depression negative neurological ROS     GI/Hepatic Neg liver ROS, GERD  Controlled,Diarrhea, N/V C Diff   Endo/Other  negative endocrine ROS  Renal/GU negative Renal ROS  negative genitourinary   Musculoskeletal negative musculoskeletal ROS (+)   Abdominal (+) + obese,   Peds  Hematology negative hematology ROS (+)   Anesthesia Other Findings Day of surgery medications reviewed with patient.  Reproductive/Obstetrics negative OB ROS                           Anesthesia Physical Anesthesia Plan  ASA: II  Anesthesia Plan: MAC   Post-op Pain Management:    Induction:   PONV Risk Score and Plan: 2 and Treatment may vary due to age or medical condition, Propofol infusion, Ondansetron and Midazolam  Airway Management Planned: Natural Airway and Simple Face Mask  Additional Equipment: None  Intra-op Plan:   Post-operative Plan:   Informed Consent: I have reviewed the patients History and Physical, chart, labs and discussed the procedure including the risks, benefits and alternatives for the proposed anesthesia with the patient or authorized representative who has indicated his/her understanding and acceptance.     Dental advisory given  Plan Discussed with: CRNA and  Surgeon  Anesthesia Plan Comments:        Anesthesia Quick Evaluation

## 2019-12-04 NOTE — Anesthesia Procedure Notes (Signed)
Date/Time: 12/04/2019 1:36 PM Performed by: Thornell Mule, CRNA Oxygen Delivery Method: Simple face mask

## 2019-12-04 NOTE — Progress Notes (Signed)
He completed the prep last night and said it was very effective. He is eager to proceed with colonoscopy and EGD later today.

## 2019-12-04 NOTE — Op Note (Signed)
[facility] Patient Name: [name] Procedure Date: 12/04/2019 MRN: [id number] Attending MD: Gerrit Heck , MD Date of Birth: 05-27-2000 CSN: [id number] Age: 19 Admit Type: Inpatient Procedure:                Colonoscopy Indications:              Generalized abdominal pain, Change in bowel habits,                            Diarrhea                           4 month history of GI symptoms. Was diagnosed with                            C diff infection in 08/2019, treated with                            Vancomycin, then Flagyl then Dificid, with negative                            C diff testing in June and again on this admission,                            now presenting with ongoing GI symptoms. CT in June                            without acute intraabdominal process. Normal WBC,                            ESR, CRP, fecal lactoferrin. CCY in summer 2020. Providers:                Gerrit Heck, MD, Benetta Spar RN, RN, Cherylynn Ridges, Technician, Rosario Adie, CRNA Referring MD:              Medicines:                Monitored Anesthesia Care Complications:            No immediate complications. Estimated Blood Loss:     Estimated blood loss was minimal. Procedure:                Pre-Anesthesia Assessment:                           - Prior to the procedure, a History and Physical                            was performed, and patient medications and                            allergies were reviewed. The patient's tolerance of  previous anesthesia was also reviewed. The risks                            and benefits of the procedure and the sedation                            options and risks were discussed with the patient.                            All questions were answered, and informed consent                            was obtained. Prior Anticoagulants: The patient has                             taken no previous anticoagulant or antiplatelet                            agents. ASA Grade Assessment: II - A patient with                            mild systemic disease. After reviewing the risks                            and benefits, the patient was deemed in                            satisfactory condition to undergo the procedure.                           After obtaining informed consent, the colonoscope                            was passed under direct vision. Throughout the                            procedure, the patient's blood pressure, pulse, and                            oxygen saturations were monitored continuously. The                            CF-HQ190L ([id number]) Olympus colonoscope was                            introduced through the anus and advanced to the the                            terminal ileum. The colonoscopy was performed                            without difficulty. The patient tolerated the  procedure well. The quality of the bowel                            preparation was adequate. The terminal ileum,                            ileocecal valve, appendiceal orifice, and rectum                            were photographed. Scope In: 1:56:30 PM Scope Out: 2:09:15 PM Scope Withdrawal Time: 0 hours 10 minutes 4 seconds  Total Procedure Duration: 0 hours 12 minutes 45 seconds  Findings:      The perianal and digital rectal examinations were normal.      The colon (entire examined portion) appeared normal. Biopsies for       histology were taken with a cold forceps from the right colon and left       colon for evaluation of microscopic colitis. Estimated blood loss was       minimal.      A moderate amount of scattered semi-liquid stool was found in the       sigmoid colon, in the ascending colon and in the cecum. Lavage of the       area was performed using copious amounts of tap water, resulting in       clearance  with good visualization.      The terminal ileum appeared normal.      The retroflexed view of the distal rectum and anal verge was normal and       showed no anal or rectal abnormalities. Impression:               - The entire examined colon is normal. Biopsied.                           - The examined portion of the ileum was normal.                           - The distal rectum and anal verge are normal on                            retroflexion view. Moderate Sedation:      Not Applicable - Patient had care per Anesthesia. Recommendation:           - Return patient to hospital ward for possible                            discharge same day.                           - Resume previous diet today.                           - Continue present medications.                           - Await pathology results.                           -  Repeat colonoscopy at age 80 for routine CRC                            screening purposes.                           - Return to GI office at appointment to be                            scheduled.                           - May consider trial of cholestyramine.                           - Start supplemental fiber.                           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                           - Please do not hesitate to contact the inpatient                            GI service with additional questions or concerns. Procedure Code(s):        --- Professional ---                           [phone], Colonoscopy, flexible; with biopsy, single                            or multiple Diagnosis Code(s):        --- Professional ---                           R10.84, Generalized abdominal pain                           R19.4, Change in bowel habit                           R19.7, Diarrhea, unspecified CPT copyright 2019 American Medical  Association. All rights reserved. The codes documented in this report are preliminary and upon coder review may  be revised to meet current compliance requirements. Gerrit Heck, MD 12/04/2019 2:33:38 PM Number of Addenda: 0

## 2019-12-04 NOTE — Progress Notes (Signed)
PROGRESS NOTE    [name]  WEX:[id number] DOB: May 19, 2000 DOA: 12/01/2019 PCP: Sherren Mocha, MD    Chief Complaint  Patient presents with  . Abdominal Pain  . Nausea    Brief Narrative:  HPI per Dr. Princess Perna  is a 19 y.o. African-American transgender adult  who identifies himself as a female with a known history of asthma, and anxiety, migraine, ADD, depression C. difficile colitis for which he was treated on an outpatient basis and tested -3 weeks ago after completion of Dificid course, who presented to the emergency room with an onset of recurrent nausea vomiting with associated diarrhea which have been worsening for the last 3 weeks with no fever or chills.  No bloody diarrhea or melena.  No coffee-ground emesis or bloody vomitus.  He has been having lower abdominal pain initiated now we can generalized.  Note dyspnea or cough or wheezing.  Is been having occasional chest pain with no palpitations.  No dysuria, oliguria or hematuria or flank pain  Upon presentation to the emergency room, blood pressure was 134/99 with otherwise normal vital signs.  Labs revealed unremarkable CMP with negative lipase of 24 and CBC showing hemoconcentration.  Urinalysis showed 11-20 WBCs with positive hyaline casts and few bacteria and large leukocytes.  The patient was given 40 mg of IV Toradol, 4 mg of IV Zofran and 1 L bolus of IV normal saline of 125 mL/h.  He will be admitted to a medical monitored bed for further evaluation and management.   Assessment & Plan:   Active Problems:   Intractable nausea and vomiting   Diarrhea   Lower abdominal pain   Nausea and vomiting   Attention deficit disorder   Gastroesophageal reflux disease   Anemia due to vitamin B12 deficiency  1 intractable nausea and vomiting with diarrhea and abdominal pain concerning for recurrent C. difficile colitis Patient presented with nausea vomiting diarrhea and some abdominal pain.  Patient treated in the  outpatient setting for C. difficile colitis and per patient with repeat C. difficile negative.  Patient presenting back with recurrent nausea vomiting abdominal pain and diarrhea.  Patient admitted.  Stool studies with negative C. difficile PCR.  CRP < 0.5.  Patient with no diarrhea since admission.  GI consulted and patient seen in consultation by Dr.[name] and patient placed on a clear liquid diet with advancement to full liquid diet as tolerated.  GI recommending awaiting stool studies. Per GI if infectious work-up is negative will proceed with endoscopic evaluation of upper endoscopy and colonoscopy and also consideration of starting patient on colestipol for possible bile salt related diarrhea inpatient post cholecystectomy. Patient maintained on Bentyl 3 times daily which patient will be continued on.  Patient was placed on scheduled IV Zofran.  Patient for EGD colonoscopy today.  GI following and appreciate input and recommendations.  2.  GERD PPI.  3.  ADD Continue Adderall XR.  4.  Vitamin B12 deficiency Continue vitamin B12 supplementation.    5.  Migraine headaches Stable.    DVT prophylaxis: Lovenox Code Status: Full Family Communication: Updated patient.  No family at bedside. Disposition:   Status is: Inpatient    Dispo: The patient is from: Home              Anticipated d/c is to: Home              Anticipated d/c date is: 2 to 3 days.  Patient currently being evaluated by GI for ongoing diarrhea, abdominal pain, nausea.  Patient for upper endoscopy and colonoscopy today.         Consultants:   Gastroenterology: Dr. Marina Goodell 12/02/2019  Procedures:   Upper endoscopy/colonoscopy pending 12/04/2019  Antimicrobials:   None   Subjective: Patient sitting up in bed on the computer.  Denies any chest pain or shortness of breath.  No emesis.  No complaints of diffuse abdominal pain.  Underwent bowel prep yesterday that went well.  Awaiting endoscopy and  colonoscopy today.   Objective: Vitals:   12/03/19 0539 12/03/19 1431 12/03/19 2121 12/04/19 0521  BP: 101/66 118/72 106/84 104/74  Pulse: (!) 44 58 54 55  Resp: 16 18 17    Temp: 97.8 F (36.6 C) 98.9 F (37.2 C) 98.3 F (36.8 C) (!) 97.3 F (36.3 C)  TempSrc:  Oral Oral Oral  SpO2: 97% 98% 96% 98%  Weight:        Intake/Output Summary (Last 24 hours) at 12/04/2019 1223 Last data filed at 12/03/2019 1900 Gross per 24 hour  Intake 5682.42 ml  Output --  Net 5682.42 ml   Filed Weights   12/01/19 2017  Weight: (!) 99.5 kg    Examination:  General exam: NAD Respiratory system: Lungs clear to auscultation bilaterally.  No wheezes, no crackles, no rhonchi.  Normal respiratory effort.  Cardiovascular system: RRR no murmurs rubs or gallops.  No JVD.  No lower extremity edema. Gastrointestinal system: Abdomen soft, nondistended, positive bowel sounds.  Diffuse tenderness to palpation.  No rebound.  No guarding. Central nervous system: Alert and oriented. No focal neurological deficits. Extremities: Symmetric 5 x 5 power. Skin: No rashes, lesions or ulcers Psychiatry: Judgement and insight appear normal. Mood & affect appropriate.     Data Reviewed: I have personally reviewed following labs and imaging studies  CBC: Recent Labs  Lab 12/01/19 1325 12/02/19 0644 12/03/19 0713 12/04/19 0722  WBC 6.7 5.4 4.5 5.3  NEUTROABS  --   --  1.8 2.4  HGB 17.0* 14.7 14.7 15.4*  HCT 50.0* 43.3 45.3 46.0  MCV 85.5 85.1 87.5 86.1  PLT 203 179 174 184    Basic Metabolic Panel: Recent Labs  Lab 12/01/19 1325 12/02/19 0644 12/02/19 0919 12/03/19 0713 12/04/19 0722  NA 140 138  --  139 139  K 3.7 3.7  --  3.7 3.7  CL 103 105  --  107 108  CO2 26 24  --  23 23  GLUCOSE 92 87  --  82 92  BUN 12 10  --  6 <5*  CREATININE 0.90 0.77  --  0.84 0.91  CALCIUM 9.4 8.7*  --  8.8* 8.5*  MG  --   --  1.9 1.9  --     GFR: Estimated Creatinine Clearance (by C-G formula based on SCr  of 0.91 mg/dL) Female: 12/06/19 mL/min Female: 149.3 mL/min  Liver Function Tests: Recent Labs  Lab 12/01/19 1325  AST 19  ALT 18  ALKPHOS 49  BILITOT 1.0  PROT 8.0  ALBUMIN 4.7    CBG: No results for input(s): GLUCAP in the last 168 hours.   Recent Results (from the past 240 hour(s))  SARS Coronavirus 2 by RT PCR (hospital order, performed in [facility] hospital lab) Nasopharyngeal Nasopharyngeal Swab     Status: None   Collection Time: 12/01/19  7:34 PM   Specimen: Nasopharyngeal Swab  Result Value Ref Range Status   SARS Coronavirus 2 NEGATIVE NEGATIVE  Final    Comment: (NOTE) SARS-CoV-2 target nucleic acids are NOT DETECTED.  The SARS-CoV-2 RNA is generally detectable in upper and lower respiratory specimens during the acute phase of infection. The lowest concentration of SARS-CoV-2 viral copies this assay can detect is 250 copies / mL. A negative result does not preclude SARS-CoV-2 infection and should not be used as the sole basis for treatment or other patient management decisions.  A negative result may occur with improper specimen collection / handling, submission of specimen other than nasopharyngeal swab, presence of viral mutation(s) within the areas targeted by this assay, and inadequate number of viral copies (<250 copies / mL). A negative result must be combined with clinical observations, patient history, and epidemiological information.  Fact Sheet for Patients:   BoilerBrush.com.cy  Fact Sheet for Healthcare Providers: https://pope.com/  This test is not yet approved or  cleared by the [place] FDA and has been authorized for detection and/or diagnosis of SARS-CoV-2 by FDA under an Emergency Use Authorization (EUA).  This EUA will remain in effect (meaning this test can be used) for the duration of the COVID-19 declaration under Section 564(b)(1) of the Act, 21 U.S.C. section 360bbb-3(b)(1), unless  the authorization is terminated or revoked sooner.  Performed at [facility], [address] [place], [place] [address]   C Difficile Quick Screen w PCR reflex     Status: None   Collection Time: 12/02/19  6:59 PM   Specimen: STOOL  Result Value Ref Range Status   C Diff antigen NEGATIVE NEGATIVE Final   C Diff toxin NEGATIVE NEGATIVE Final   C Diff interpretation No C. difficile detected.  Final    Comment: VALID Performed at [facility], [address] [place], [place] [address]          Radiology Studies: No results found.      Scheduled Meds: . dicyclomine  10 mg Oral TID AC  . enoxaparin (LOVENOX) injection  40 mg Subcutaneous Q24H  . feeding supplement  1 Container Oral TID BM  . multivitamin with minerals  1 tablet Oral Daily  . ondansetron (ZOFRAN) IV  4 mg Intravenous Q6H   Or  . ondansetron  4 mg Oral Q6H  . pantoprazole  40 mg Oral Daily  . cyanocobalamin  1,000 mcg Oral Daily  . [START ON 12/05/2019] Vitamin D (Ergocalciferol)  50,000 Units Oral Weekly   Continuous Infusions: . sodium chloride 1,000 mL (12/04/19 0750)     LOS: 3 days    Time spent: 35 minutes    Ramiro Harvest, MD [facility]   To contact the attending provider between 7A-7P or the covering provider during after hours 7P-7A, please log into the web site [email or web address] and access using universal [place] password for that web site. If you do not have the password, please call the hospital operator.  12/04/2019, 12:23 PM

## 2019-12-04 NOTE — Transfer of Care (Signed)
Immediate Anesthesia Transfer of Care Note  Patient: [name]  Procedure(s) Performed: COLONOSCOPY WITH PROPOFOL (N/A ) ESOPHAGOGASTRODUODENOSCOPY (EGD) WITH PROPOFOL (N/A ) BIOPSY  Patient Location: PACU  Anesthesia Type:MAC  Level of Consciousness: drowsy, patient cooperative and responds to stimulation  Airway & Oxygen Therapy: Patient Spontanous Breathing and Patient connected to face mask oxygen  Post-op Assessment: Report given to RN and Post -op Vital signs reviewed and stable  Post vital signs: Reviewed and stable  Last Vitals:  Vitals Value Taken Time  BP 161/67 12/04/19 1420  Temp    Pulse 71 12/04/19 1421  Resp 14 12/04/19 1421  SpO2 100 % 12/04/19 1421  Vitals shown include unvalidated device data.  Last Pain:  Vitals:   12/04/19 1313  TempSrc: Oral  PainSc: 0-No pain      Patients Stated Pain Goal: 3 (52/84/13 2440)  Complications: No complications documented.

## 2019-12-04 NOTE — Interval H&P Note (Signed)
History and Physical Interval Note:  12/04/2019 1:17 PM  [name]  has presented today for surgery, with the diagnosis of Diarrhea, N/V.  The various methods of treatment have been discussed with the patient and family. After consideration of risks, benefits and other options for treatment, the patient has consented to  Procedure(s): COLONOSCOPY WITH PROPOFOL (N/A) ESOPHAGOGASTRODUODENOSCOPY (EGD) WITH PROPOFOL (N/A) as a surgical intervention.  The patient's history has been reviewed, patient examined, no change in status, stable for surgery.  I have reviewed the patient's chart and labs.  Questions were answered to the patient's satisfaction.     Verlin Dike Javeon Macmurray

## 2019-12-05 ENCOUNTER — Other Ambulatory Visit: Payer: Self-pay

## 2019-12-05 ENCOUNTER — Encounter (HOSPITAL_COMMUNITY): Payer: Self-pay | Admitting: Gastroenterology

## 2019-12-05 LAB — CBC
HCT: 44.8 % (ref 36.0–46.0)
Hemoglobin: 15 g/dL (ref 12.0–15.0)
MCH: 28.7 pg (ref 26.0–34.0)
MCHC: 33.5 g/dL (ref 30.0–36.0)
MCV: 85.7 fL (ref 80.0–100.0)
Platelets: 186 10*3/uL (ref 150–400)
RBC: 5.23 MIL/uL — ABNORMAL HIGH (ref 3.87–5.11)
RDW: 12.4 % (ref 11.5–15.5)
WBC: 6.8 10*3/uL (ref 4.0–10.5)
nRBC: 0 % (ref 0.0–0.2)

## 2019-12-05 LAB — BASIC METABOLIC PANEL
Anion gap: 10 (ref 5–15)
BUN: 5 mg/dL — ABNORMAL LOW (ref 6–20)
CO2: 22 mmol/L (ref 22–32)
Calcium: 8.6 mg/dL — ABNORMAL LOW (ref 8.9–10.3)
Chloride: 107 mmol/L (ref 98–111)
Creatinine, Ser: 0.87 mg/dL (ref 0.44–1.00)
GFR calc Af Amer: >60 mL/min (ref >60)
GFR calc non Af Amer: >60 mL/min (ref >60)
Glucose, Bld: 89 mg/dL (ref 70–99)
Potassium: 3.4 mmol/L — ABNORMAL LOW (ref 3.5–5.1)
Sodium: 139 mmol/L (ref 135–145)

## 2019-12-05 LAB — SURGICAL PATHOLOGY

## 2019-12-05 MED ORDER — TRAMADOL HCL 50 MG PO TABS: 50.0000 mg | ORAL_TABLET | Freq: Four times a day (QID) | ORAL | 0 refills | Status: DC | PRN

## 2019-12-05 MED ORDER — POTASSIUM CHLORIDE CRYS ER 20 MEQ PO TBCR
40.0000 meq | EXTENDED_RELEASE_TABLET | Freq: Once | ORAL | Status: AC
  Administered 2019-12-05: 40 meq via ORAL
  Filled 2019-12-05: qty 2

## 2019-12-05 MED ORDER — CALCIUM POLYCARBOPHIL 625 MG PO TABS
625.0000 mg | ORAL_TABLET | Freq: Every day | ORAL | Status: DC
  Administered 2019-12-05: 625 mg via ORAL
  Filled 2019-12-05: qty 1

## 2019-12-05 MED ORDER — OMEPRAZOLE 40 MG PO CPDR: 40.0000 mg | DELAYED_RELEASE_CAPSULE | Freq: Every day | ORAL | 0 refills | Status: DC

## 2019-12-05 MED ORDER — DICYCLOMINE HCL 10 MG PO CAPS: 10.0000 mg | ORAL_CAPSULE | Freq: Three times a day (TID) | ORAL | 0 refills | Status: DC

## 2019-12-05 MED ORDER — CALCIUM POLYCARBOPHIL 625 MG PO TABS: 625.0000 mg | ORAL_TABLET | Freq: Every day | ORAL | Status: DC

## 2019-12-05 MED ORDER — ONDANSETRON 8 MG PO TBDP: 8.0000 mg | ORAL_TABLET | Freq: Three times a day (TID) | ORAL | 0 refills | Status: DC | PRN

## 2019-12-05 NOTE — Anesthesia Postprocedure Evaluation (Signed)
Anesthesia Post Note  Patient: [name]  Procedure(s) Performed: COLONOSCOPY WITH PROPOFOL (N/A ) ESOPHAGOGASTRODUODENOSCOPY (EGD) WITH PROPOFOL (N/A ) BIOPSY     Patient location during evaluation: PACU Anesthesia Type: MAC Level of consciousness: awake and alert Pain management: pain level controlled Vital Signs Assessment: post-procedure vital signs reviewed and stable Respiratory status: spontaneous breathing, nonlabored ventilation, respiratory function stable and patient connected to nasal cannula oxygen Cardiovascular status: stable and blood pressure returned to baseline Postop Assessment: no apparent nausea or vomiting Anesthetic complications: no   No complications documented.  Last Vitals:  Vitals:   12/05/19 0649 12/05/19 1213  BP: 108/67 120/76  Pulse: 50 48  Resp: 16 18  Temp: 36.7 C 36.7 C  SpO2: 99% 98%    Last Pain:  Vitals:   12/05/19 1410  TempSrc:   PainSc: 4                  Tiajuana Amass

## 2019-12-05 NOTE — Discharge Summary (Signed)
Physician Discharge Summary  [name] DOB: November 22, 2000 DOA: 12/01/2019  PCP: Sherren Mocha, MD  Admit date: 12/01/2019 Discharge date: 12/05/2019  Time spent: 55 minutes  Recommendations for Outpatient Follow-up:  1. Follow-up with Sherren Mocha, MD in 2 weeks.  On follow-up patient need a basic metabolic profile done to follow-up on electrolytes and renal function.  Patient will need a magnesium level checked. 2. Follow-up with Willette Cluster, gastroenterology.  Office will call with appointment time.   Discharge Diagnoses:  Active Problems:   Intractable nausea and vomiting   Diarrhea   Lower abdominal pain   Nausea and vomiting   Attention deficit disorder   Gastroesophageal reflux disease   Anemia due to vitamin B12 deficiency   Generalized abdominal pain   Discharge Condition: Stable  Diet recommendation: Soft diet  Filed Weights   12/01/19 2017 12/04/19 1313  Weight: (!) 99.5 kg (!) 99.5 kg    History of present illness:  HPI per Dr.Mansy [name]  is a 19 y.o. African-American transgender adult  who identifies himself as a female with a known history of asthma, and anxiety, migraine, ADD, depression C. difficile colitis for which he was treated on an outpatient basis and tested -3 weeks ago after completion of Dificid course, who presented to the emergency room with an onset of recurrent nausea vomiting with associated diarrhea which have been worsening for the last 3 weeks with no fever or chills.  No bloody diarrhea or melena.  No coffee-ground emesis or bloody vomitus.  He had been having lower abdominal pain initiated now we can generalized.  Note dyspnea or cough or wheezing.  Is been having occasional chest pain with no palpitations.  No dysuria, oliguria or hematuria or flank pain  Upon presentation to the emergency room, blood pressure was 134/99 with otherwise normal vital signs.  Labs revealed unremarkable CMP with negative lipase of 24 and CBC  showing hemoconcentration.  Urinalysis showed 11-20 WBCs with positive hyaline casts and few bacteria and large leukocytes.  The patient was given 40 mg of IV Toradol, 4 mg of IV Zofran and 1 L bolus of IV normal saline of 125 mL/h.  He will be admitted to a medical monitored bed for further evaluation and management.  Hospital Course:  1 intractable nausea and vomiting with diarrhea and abdominal pain concerning for recurrent C. 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 Patient presented with nausea, vomiting, diarrhea and some abdominal pain.  Patient treated in the outpatient setting for C. difficile colitis and per patient with repeat C. difficile negative.  Patient presented back with recurrent nausea vomiting abdominal pain and diarrhea.  Patient admitted.  Stool studies with negative C. difficile PCR.  CRP < 0.5.  Patient with no diarrhea since admission.  GI consulted and patient seen in consultation by Dr.[name] and patient placed on a clear liquid diet with advancement to full liquid diet as tolerated.  GI recommended awaiting stool studies. Per GI if infectious work-up is negative will proceed with endoscopic evaluation of upper endoscopy and colonoscopy and also consideration of starting patient on colestipol for possible bile salt related diarrhea inpatient post cholecystectomy. Patient maintained on Bentyl 3 times daily which patient will be continued on.  Patient was placed on scheduled IV Zofran.  Patient subsequently underwent EGD/colonoscopy 12/04/2019 which was normal per GI.  GI felt patient symptoms may be secondary to postinfectious IBS and recommended fiber.  Patient was placed on a soft diet.  Patient  was discharged in stable and improved condition with close outpatient follow-up with GI.   2.  GERD PPI.  3.  ADD Patient maintained on home regimen Adderall XR.  4.  Vitamin B12 deficiency Patient maintained on vitamin B12 supplementation.  Outpatient  follow-up.  5.  Migraine headaches Stable.  Procedures:  Upper endoscopy/colonoscopy 12/04/2019--Dr. Cirigliano  Consultations:  Gastroenterology: Dr. Marina GoodellPerry 12/02/2019  Discharge Exam: Vitals:   12/05/19 0649 12/05/19 1213  BP: 108/67 120/76  Pulse: 50 48  Resp: 16 18  Temp: 98 F (36.7 C) 98 F (36.7 C)  SpO2: 99% 98%    General: NAD Cardiovascular: RRR Respiratory: CTAB  Discharge Instructions   Discharge Instructions    Diet - low sodium heart healthy   Complete by: As directed    Soft diet.   Increase activity slowly   Complete by: As directed      Allergies as of 12/05/2019      Reactions   Bee Pollen Anaphylaxis   Mold Extract [trichophyton] Anaphylaxis, Itching   Pollen Extract Anaphylaxis   Zyrtec [cetirizine] Anaphylaxis   Mixed Ragweed Itching      Medication List    STOP taking these medications   Adderall XR 20 MG 24 hr capsule Generic drug: amphetamine-dextroamphetamine   oxyCODONE 5 MG immediate release tablet Commonly known as: Oxy IR/ROXICODONE   rizatriptan 10 MG disintegrating tablet Commonly known as: MAXALT-MLT   vancomycin 125 MG capsule Commonly known as: VANCOCIN     TAKE these medications   albuterol 108 (90 Base) MCG/ACT inhaler Commonly known as: VENTOLIN HFA Inhale 2 puffs into the lungs every 6 (six) hours as needed for wheezing or shortness of breath.   amitriptyline 10 MG tablet Commonly known as: ELAVIL Take 10 mg by mouth at bedtime as needed for sleep.   cyanocobalamin 1000 MCG tablet Take 1 tablet by mouth daily.   dicyclomine 10 MG capsule Commonly known as: BENTYL Take 1 capsule (10 mg total) by mouth 3 (three) times daily before meals.   ergocalciferol 1.25 MG (50000 UT) capsule Commonly known as: VITAMIN D2 Take 50,000 Units by mouth once a week.   naproxen 375 MG tablet Commonly known as: NAPROSYN Take 1 tablet (375 mg total) by mouth 2 (two) times daily. What changed:   when to take  this  reasons to take this   Nurtec 75 MG Tbdp Generic drug: Rimegepant Sulfate Take 1 tablet by mouth daily as needed (headache).   omeprazole 40 MG capsule Commonly known as: PRILOSEC Take 1 capsule (40 mg total) by mouth daily. What changed: when to take this   ondansetron 8 MG disintegrating tablet Commonly known as: ZOFRAN-ODT Take 1 tablet (8 mg total) by mouth every 8 (eight) hours as needed.   polycarbophil 625 MG tablet Commonly known as: FIBERCON Take 1 tablet (625 mg total) by mouth daily. Start taking on: December 06, 2019   testosterone cypionate 200 MG/ML injection Commonly known as: DEPOTESTOSTERONE CYPIONATE Inject 7.75 mLs into the muscle every 14 (fourteen) days.   traMADol 50 MG tablet Commonly known as: ULTRAM Take 1 tablet (50 mg total) by mouth every 6 (six) hours as needed for moderate pain.      Allergies  Allergen Reactions  . Bee Pollen Anaphylaxis  . Mold Extract [Trichophyton] Anaphylaxis and Itching  . Pollen Extract Anaphylaxis  . Zyrtec [Cetirizine] Anaphylaxis  . Mixed Ragweed Itching    Follow-up Information    Sherren MochaShaw, Eva N, MD. Schedule an appointment as soon  as possible for a visit in 2 week(s).   Specialty: Family Medicine Contact information: [address] [place] [address] [phone]        Meredith Pel, NP Follow up.   Specialty: Gastroenterology Why: Office will call with follow-up appointment time Contact information: [address] [place] [address] [phone]                The results of significant diagnostics from this hospitalization (including imaging, microbiology, ancillary and laboratory) are listed below for reference.    Significant Diagnostic Studies: DG Lumbar Spine Complete  Result Date: 11/08/2019 CLINICAL DATA:  Tenderness of back. Severe vertebral tenderness for 2 weeks with intermittent radiculopathy. Additional history provided: Patient reports low back pain for 3  months. EXAM: LUMBAR SPINE - COMPLETE 4+ VIEW COMPARISON:  CT abdomen/pelvis 10/25/2019 FINDINGS: Transitional lumbosacral anatomy. No significant spondylolisthesis. Vertebral body height is maintained. No evidence of lumbar compression fracture. Intervertebral disc height is maintained. Surgical clips within the right upper quadrant of the abdomen. IMPRESSION: Transitional lumbosacral anatomy. No evidence of lumbar compression fracture. No significant appreciable lumbar spondylosis. Electronically Signed   By: Cynthia Loge DO   On: 11/08/2019 15:19    Microbiology: Recent Results (from the past 240 hour(s))  SARS Coronavirus 2 by RT PCR (hospital order, performed in [facility] hospital lab) Nasopharyngeal Nasopharyngeal Swab     Status: None   Collection Time: 12/01/19  7:34 PM   Specimen: Nasopharyngeal Swab  Result Value Ref Range Status   SARS Coronavirus 2 NEGATIVE NEGATIVE Final    Comment: (NOTE) SARS-CoV-2 target nucleic acids are NOT DETECTED.  The SARS-CoV-2 RNA is generally detectable in upper and lower respiratory specimens during the acute phase of infection. The lowest concentration of SARS-CoV-2 viral copies this assay can detect is 250 copies / mL. A negative result does not preclude SARS-CoV-2 infection and should not be used as the sole basis for treatment or other patient management decisions.  A negative result may occur with improper specimen collection / handling, submission of specimen other than nasopharyngeal swab, presence of viral mutation(s) within the areas targeted by this assay, and inadequate number of viral copies (<250 copies / mL). A negative result must be combined with clinical observations, patient history, and epidemiological information.  Fact Sheet for Patients:   BoilerBrush.com.cy  Fact Sheet for Healthcare Providers: https://pope.com/  This test is not yet approved or  cleared by the [place] FDA and has been authorized for detection and/or diagnosis of SARS-CoV-2 by FDA under an Emergency Use Authorization (EUA).  This EUA will remain in effect (meaning this test can be used) for the duration of the COVID-19 declaration under Section 564(b)(1) of the Act, 21 U.S.C. section 360bbb-3(b)(1), unless the authorization is terminated or revoked sooner.  Performed at [facility], [address], [place], [place] [address]   C Difficile Quick Screen w PCR reflex     Status: None   Collection Time: 12/02/19  6:59 PM   Specimen: STOOL  Result Value Ref Range Status   C Diff antigen NEGATIVE NEGATIVE Final   C Diff toxin NEGATIVE NEGATIVE Final   C Diff interpretation No C. difficile detected.  Final    Comment: VALID Performed at [facility], [address], [place], [place] [address]      Labs: Basic Metabolic Panel: Recent Labs  Lab 12/01/19 1325 12/02/19 6195 12/02/19 0919 12/03/19 0932 12/04/19 0722 12/05/19 0555  NA  140 138  --  139 139 139  K 3.7 3.7  --  3.7 3.7 3.4*  CL 103 105  --  107 108 107  CO2 26 24  --  23 23 22   GLUCOSE 92 87  --  82 92 89  BUN 12 10  --  6 <5* 5*  CREATININE 0.90 0.77  --  0.84 0.91 0.87  CALCIUM 9.4 8.7*  --  8.8* 8.5* 8.6*  MG  --   --  1.9 1.9  --   --    Liver Function Tests: Recent Labs  Lab 12/01/19 1325  AST 19  ALT 18  ALKPHOS 49  BILITOT 1.0  PROT 8.0  ALBUMIN 4.7   Recent Labs  Lab 12/01/19 1325  LIPASE 24   No results for input(s): AMMONIA in the last 168 hours. CBC: Recent Labs  Lab 12/01/19 1325 12/02/19 0644 12/03/19 0713 12/04/19 0722 12/05/19 0555  WBC 6.7 5.4 4.5 5.3 6.8  NEUTROABS  --   --  1.8 2.4  --   HGB 17.0* 14.7 14.7 15.4* 15.0  HCT 50.0* 43.3 45.3 46.0 44.8  MCV 85.5 85.1 87.5 86.1 85.7  PLT 203 179 174 184 186   Cardiac Enzymes: No results for input(s): CKTOTAL, CKMB, CKMBINDEX, TROPONINI in the last 168 hours. BNP: BNP (last 3  results) No results for input(s): BNP in the last 8760 hours.  ProBNP (last 3 results) No results for input(s): PROBNP in the last 8760 hours.  CBG: No results for input(s): GLUCAP in the last 168 hours.     Signed:  12/07/19 MD.  [facility] 12/05/2019, 3:08 PM

## 2019-12-05 NOTE — Progress Notes (Signed)
Patient reports he had some diarrhea and vomiting around 1130 am. PRN were given. Will continue to monitor.

## 2019-12-07 ENCOUNTER — Encounter: Payer: Self-pay | Admitting: Gastroenterology

## 2019-12-08 LAB — STOOL CULTURE REFLEX - CMPCXR

## 2019-12-08 LAB — STOOL CULTURE: E coli, Shiga toxin Assay: NEGATIVE

## 2019-12-08 LAB — STOOL CULTURE REFLEX - RSASHR

## 2019-12-14 ENCOUNTER — Encounter: Payer: BC Managed Care – PPO | Admitting: Internal Medicine

## 2020-01-03 ENCOUNTER — Encounter: Payer: Self-pay | Admitting: Physician Assistant

## 2020-01-03 ENCOUNTER — Ambulatory Visit (INDEPENDENT_AMBULATORY_CARE_PROVIDER_SITE_OTHER): Payer: BC Managed Care – PPO | Admitting: Physician Assistant

## 2020-01-03 VITALS — BP 118/80 | HR 74 | Ht 69.0 in | Wt 216.0 lb

## 2020-01-03 DIAGNOSIS — R1084 Generalized abdominal pain: Secondary | ICD-10-CM

## 2020-01-03 DIAGNOSIS — R197 Diarrhea, unspecified: Secondary | ICD-10-CM

## 2020-01-03 MED ORDER — CHOLESTYRAMINE 4 G PO PACK
4.0000 g | PACK | Freq: Two times a day (BID) | ORAL | 5 refills | Status: DC
Start: 2020-01-03 — End: 2020-11-04

## 2020-01-03 NOTE — Progress Notes (Signed)
Noted.  After extensive evaluation work-up, felt to have functional GI complaints.  Duane Boston is doing better

## 2020-01-03 NOTE — Progress Notes (Signed)
Chief Complaint: Follow-up after hospitalization for abdominal pain and diarrhea  HPI:    [name] is a 19 year old female to female transgender, assigned to Dr. Marina Goodell, who returns to clinic today for follow-up of abdominal pain and diarrhea after recent hospitalization.    12/02/2019-12/04/2019 patient admitted to the hospital for diarrhea, abdominal pain and nausea (see that consult note for more details).  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  That time had repeat stool culture, C. difficile and lactoferrin which were negative and had EGD and colonoscopy.  Bentyl was added 10 mg p.o. 3 times a day.    12/04/2019 colonoscopy and EGD were normal.  Pathology was negative.    Today, the patient presents to clinic accompanied by his partner and tells me that his abdominal pain is much better using the Dicyclomine 10 mg 3 times a day and he has no further nausea but does continue to feel like he does not have an appetite.  Does tell me though he has gained about 15 pounds over the past couple of weeks after initially losing about 30.  Tells me he still tends to have loose stools about 3-4 times a day, sometimes these are slightly more formed than others but they are mostly looser.    Denies fever, chills or blood in the stool.  Past Medical History:  Diagnosis Date  . Anxiety   . Asthma   . B12 deficiency   . C. difficile diarrhea   . Female-to-female transgender person   . Gallbladder sludge   . Post concussion syndrome   . Severe depression (HCC)     Past Surgical History:  Procedure Laterality Date  . BIOPSY  12/04/2019   Procedure: BIOPSY;  Surgeon: Shellia Cleverly, DO;  Location: [facility];  Service: Gastroenterology;;  . brain cyst removal    . CHOLECYSTECTOMY    . COLONOSCOPY WITH PROPOFOL N/A 12/04/2019   Procedure: COLONOSCOPY WITH PROPOFOL;  Surgeon: Shellia Cleverly, DO;  Location: [facility];  Service: Gastroenterology;  Laterality: N/A;  .  ESOPHAGOGASTRODUODENOSCOPY (EGD) WITH PROPOFOL N/A 12/04/2019   Procedure: ESOPHAGOGASTRODUODENOSCOPY (EGD) WITH PROPOFOL;  Surgeon: Shellia Cleverly, DO;  Location: [facility];  Service: Gastroenterology;  Laterality: N/A;  . LIGAMENT REPAIR Left    radial  . thumb surgery    . ULNAR NERVE REPAIR Left 09/11/2019  . WISDOM TOOTH EXTRACTION      Current Outpatient Medications  Medication Sig Dispense Refill  . albuterol (PROVENTIL HFA;VENTOLIN HFA) 108 (90 Base) MCG/ACT inhaler Inhale 2 puffs into the lungs every 6 (six) hours as needed for wheezing or shortness of breath.    Marland Kitchen amitriptyline (ELAVIL) 10 MG tablet Take 10 mg by mouth at bedtime as needed for sleep.     . cyanocobalamin 1000 MCG tablet Take 1 tablet by mouth daily.    Marland Kitchen dicyclomine (BENTYL) 10 MG capsule Take 1 capsule (10 mg total) by mouth 3 (three) times daily before meals. 90 capsule 0  . ergocalciferol (VITAMIN D2) 1.25 MG (50000 UT) capsule Take 50,000 Units by mouth once a week.    . naproxen (NAPROSYN) 375 MG tablet Take 1 tablet (375 mg total) by mouth 2 (two) times daily. (Patient taking differently: Take 375 mg by mouth 2 (two) times daily as needed for moderate pain. ) 20 tablet 0  . omeprazole (PRILOSEC) 40 MG capsule Take 1 capsule (40 mg total) by mouth daily. 30 capsule 0  . ondansetron (ZOFRAN-ODT) 8 MG disintegrating tablet Take  1 tablet (8 mg total) by mouth every 8 (eight) hours as needed. 20 tablet 0  . polycarbophil (FIBERCON) 625 MG tablet Take 1 tablet (625 mg total) by mouth daily.    . Rimegepant Sulfate (NURTEC) 75 MG TBDP Take 1 tablet by mouth daily as needed (headache).     . testosterone cypionate (DEPOTESTOSTERONE CYPIONATE) 200 MG/ML injection Inject 7.75 mLs into the muscle every 14 (fourteen) days.     . traMADol (ULTRAM) 50 MG tablet Take 1 tablet (50 mg total) by mouth every 6 (six) hours as needed for moderate pain. 15 tablet 0  . cholestyramine (QUESTRAN) 4 g packet Take 1 packet (4 g  total) by mouth 2 (two) times daily. 60 each 5   No current facility-administered medications for this visit.    Allergies as of 01/03/2020 - Review Complete 01/03/2020  Allergen Reaction Noted  . Bee pollen Anaphylaxis 07/20/2018  . Mold extract [trichophyton] Anaphylaxis and Itching 03/14/2017  . Pollen extract Anaphylaxis 07/20/2018  . Zyrtec [cetirizine] Anaphylaxis 03/14/2017  . Mixed ragweed Itching 09/07/2019    Family History  Problem Relation Age of Onset  . Seizures Maternal Aunt   . Heart disease Paternal Aunt   . Diabetes Maternal Grandmother   . Heart disease Maternal Grandmother   . Heart disease Maternal Grandfather   . Deep vein thrombosis Maternal Grandfather   . Hyperlipidemia Maternal Grandfather   . Stomach cancer Paternal Grandfather   . Colon cancer Neg Hx   . Esophageal cancer Neg Hx   . Pancreatic cancer Neg Hx     Social History   Socioeconomic History  . Marital status: Single    Spouse name: Not on file  . Number of children: Not on file  . Years of education: Not on file  . Highest education level: Not on file  Occupational History  . Not on file  Tobacco Use  . Smoking status: Never Smoker  . Smokeless tobacco: Never Used  Vaping Use  . Vaping Use: Every day  . Substances: Nicotine, THC, Flavoring  Substance and Sexual Activity  . Alcohol use: Yes    Comment: occasionally  . Drug use: Yes    Types: Marijuana  . Sexual activity: Not Currently  Other Topics Concern  . Not on file  Social History Narrative  . Not on file   Social Determinants of Health   Financial Resource Strain:   . Difficulty of Paying Living Expenses: Not on file  Food Insecurity:   . Worried About Programme researcher, broadcasting/film/video in the Last Year: Not on file  . Ran Out of Food in the Last Year: Not on file  Transportation Needs:   . Lack of Transportation (Medical): Not on file  . Lack of Transportation (Non-Medical): Not on file  Physical Activity:   . Days of  Exercise per Week: Not on file  . Minutes of Exercise per Session: Not on file  Stress:   . Feeling of Stress : Not on file  Social Connections:   . Frequency of Communication with Friends and Family: Not on file  . Frequency of Social Gatherings with Friends and Family: Not on file  . Attends Religious Services: Not on file  . Active Member of Clubs or Organizations: Not on file  . Attends Banker Meetings: Not on file  . Marital Status: Not on file  Intimate Partner Violence:   . Fear of Current or Ex-Partner: Not on file  . Emotionally Abused: Not  on file  . Physically Abused: Not on file  . Sexually Abused: Not on file    Review of Systems:    Constitutional: No weight loss, fever or chills Cardiovascular: No chest pain Respiratory: No SOB Gastrointestinal: See HPI and otherwise negative   Physical Exam:  Vital signs: BP 118/80 (BP Location: Left Arm, Patient Position: Sitting)   Pulse 74   Ht 5\' 9"  (1.753 m)   Wt 216 lb (98 kg)   SpO2 99%   BMI 31.90 kg/m   Constitutional:   Pleasant transgender female-maleappears to be in NAD, Well developed, Well nourished, alert and cooperative Respiratory: Respirations even and unlabored. Lungs clear to auscultation bilaterally.   No wheezes, crackles, or rhonchi.  Cardiovascular: Normal S1, S2. No MRG. Regular rate and rhythm. No peripheral edema, cyanosis or pallor.  Gastrointestinal:  Soft, nondistended, nontender. No rebound or guarding. Normal bowel sounds. No appreciable masses or hepatomegaly. Rectal:  Not performed.  Psychiatric: Demonstrates good judgement and reason without abnormal affect or behaviors.  RELEVANT LABS AND IMAGING: CBC    Component Value Date/Time   WBC 6.8 12/05/2019 0555   RBC 5.23 (H) 12/05/2019 0555   HGB 15.0 12/05/2019 0555   HCT 44.8 12/05/2019 0555   PLT 186 12/05/2019 0555   MCV 85.7 12/05/2019 0555   MCH 28.7 12/05/2019 0555   MCHC 33.5 12/05/2019 0555   RDW 12.4 12/05/2019  0555   LYMPHSABS 2.3 12/04/2019 0722   MONOABS 0.5 12/04/2019 0722   EOSABS 0.1 12/04/2019 0722   BASOSABS 0.0 12/04/2019 0722    CMP     Component Value Date/Time   NA 139 12/05/2019 0555   K 3.4 (L) 12/05/2019 0555   CL 107 12/05/2019 0555   CO2 22 12/05/2019 0555   GLUCOSE 89 12/05/2019 0555   BUN 5 (L) 12/05/2019 0555   CREATININE 0.87 12/05/2019 0555   CALCIUM 8.6 (L) 12/05/2019 0555   PROT 8.0 12/01/2019 1325   ALBUMIN 4.7 12/01/2019 1325   AST 19 12/01/2019 1325   ALT 18 12/01/2019 1325   ALKPHOS 49 12/01/2019 1325   BILITOT 1.0 12/01/2019 1325   GFRNONAA >60 12/05/2019 0555   GFRAA >60 12/05/2019 0555    Assessment: 1.  IBS-D/post Coley diarrhea: Patient is improved on dicyclomine 3 times daily 2.  Generalized abdominal pain: Better with dicyclomine  Plan: 1.  Added cholestyramine 4 g packets twice daily #60 with 5 refills. 2.  Continue dicyclomine 3 times daily. 3.  Patient to follow in clinic with 12/07/2019 as needed in the future.  Told him to contact me through MyChart to let me know how things are going.  [place], PA-C [facility] 01/03/2020, 2:36 PM  Cc: 01/05/2020, MD

## 2020-01-03 NOTE — Patient Instructions (Addendum)
Continue Bentyl twice daily.   START: Cholestyramine 4mg  packets- Take three times daily.   We have sent the following medications to your pharmacy for you to pick up at your convenience: Cholestyramine   If you are age 21 or older, your body mass index should be between 23-30. Your Body mass index is 31.9 kg/m. If this is out of the aforementioned range listed, please consider follow up with your Primary Care Provider.  If you are age 73 or younger, your body mass index should be between 19-25. Your Body mass index is 31.9 kg/m. If this is out of the aformentioned range listed, please consider follow up with your Primary Care Provider.    Thank you for choosing me and [facility].  06-13-1971

## 2020-03-06 ENCOUNTER — Other Ambulatory Visit: Payer: Self-pay | Admitting: Physician Assistant

## 2020-07-26 ENCOUNTER — Other Ambulatory Visit: Payer: Self-pay | Admitting: Family Medicine

## 2020-07-26 ENCOUNTER — Ambulatory Visit
Admission: RE | Admit: 2020-07-26 | Discharge: 2020-07-26 | Disposition: A | Discharge status: Home / Self Care | Payer: BC Managed Care – PPO | Source: Ambulatory Visit | Attending: Family Medicine | Admitting: Family Medicine

## 2020-07-26 DIAGNOSIS — R609 Edema, unspecified: Secondary | ICD-10-CM

## 2020-07-26 DIAGNOSIS — M79646 Pain in unspecified finger(s): Secondary | ICD-10-CM

## 2020-10-06 ENCOUNTER — Emergency Department (HOSPITAL_COMMUNITY): Payer: BC Managed Care – PPO

## 2020-10-06 ENCOUNTER — Other Ambulatory Visit: Payer: Self-pay

## 2020-10-06 ENCOUNTER — Emergency Department (HOSPITAL_COMMUNITY)
Admission: EM | Admit: 2020-10-06 | Discharge: 2020-10-06 | Disposition: A | Discharge status: Home / Self Care | Payer: BC Managed Care – PPO | Attending: Emergency Medicine | Admitting: Emergency Medicine

## 2020-10-06 ENCOUNTER — Encounter (HOSPITAL_COMMUNITY): Payer: Self-pay

## 2020-10-06 DIAGNOSIS — Z20822 Contact with and (suspected) exposure to covid-19: Secondary | ICD-10-CM | POA: Diagnosis not present

## 2020-10-06 DIAGNOSIS — J45901 Unspecified asthma with (acute) exacerbation: Secondary | ICD-10-CM

## 2020-10-06 DIAGNOSIS — R0602 Shortness of breath: Secondary | ICD-10-CM | POA: Diagnosis present

## 2020-10-06 LAB — CBC
HCT: 47.9 % — ABNORMAL HIGH (ref 36.0–46.0)
Hemoglobin: 16.4 g/dL — ABNORMAL HIGH (ref 12.0–15.0)
MCH: 29.5 pg (ref 26.0–34.0)
MCHC: 34.2 g/dL (ref 30.0–36.0)
MCV: 86.2 fL (ref 80.0–100.0)
Platelets: 198 10*3/uL (ref 150–400)
RBC: 5.56 MIL/uL — ABNORMAL HIGH (ref 3.87–5.11)
RDW: 12.6 % (ref 11.5–15.5)
WBC: 7.7 10*3/uL (ref 4.0–10.5)
nRBC: 0 % (ref 0.0–0.2)

## 2020-10-06 LAB — BASIC METABOLIC PANEL
Anion gap: 8 (ref 5–15)
BUN: 5 mg/dL — ABNORMAL LOW (ref 6–20)
CO2: 27 mmol/L (ref 22–32)
Calcium: 9.6 mg/dL (ref 8.9–10.3)
Chloride: 104 mmol/L (ref 98–111)
Creatinine, Ser: 0.75 mg/dL (ref 0.44–1.00)
GFR, Estimated: >60 mL/min (ref >60)
Glucose, Bld: 104 mg/dL — ABNORMAL HIGH (ref 70–99)
Potassium: 3.7 mmol/L (ref 3.5–5.1)
Sodium: 139 mmol/L (ref 135–145)

## 2020-10-06 LAB — TROPONIN I (HIGH SENSITIVITY): Troponin I (High Sensitivity): <2 ng/L (ref <18)

## 2020-10-06 LAB — D-DIMER, QUANTITATIVE: D-Dimer, Quant: 0.34 ug/mL-FEU (ref 0.00–0.50)

## 2020-10-06 LAB — RESP PANEL BY RT-PCR (FLU A&B, COVID) ARPGX2
Influenza A by PCR: NEGATIVE
Influenza B by PCR: NEGATIVE
SARS Coronavirus 2 by RT PCR: NEGATIVE

## 2020-10-06 MED ORDER — PREDNISONE 20 MG PO TABS
60.0000 mg | ORAL_TABLET | Freq: Once | ORAL | Status: AC
  Administered 2020-10-06: 60 mg via ORAL
  Filled 2020-10-06: qty 3

## 2020-10-06 MED ORDER — KETOROLAC TROMETHAMINE 15 MG/ML IJ SOLN
15.0000 mg | Freq: Once | INTRAMUSCULAR | Status: AC
  Administered 2020-10-06: 15 mg via INTRAVENOUS
  Filled 2020-10-06: qty 1

## 2020-10-06 MED ORDER — ALBUTEROL SULFATE HFA 108 (90 BASE) MCG/ACT IN AERS
2.0000 | INHALATION_SPRAY | RESPIRATORY_TRACT | Status: DC | PRN
  Administered 2020-10-06: 2 via RESPIRATORY_TRACT
  Filled 2020-10-06: qty 6.7

## 2020-10-06 MED ORDER — IPRATROPIUM BROMIDE HFA 17 MCG/ACT IN AERS
2.0000 | INHALATION_SPRAY | Freq: Once | RESPIRATORY_TRACT | Status: AC
  Administered 2020-10-06: 2 via RESPIRATORY_TRACT
  Filled 2020-10-06: qty 12.9

## 2020-10-06 MED ORDER — ALBUTEROL SULFATE (2.5 MG/3ML) 0.083% IN NEBU
5.0000 mg | INHALATION_SOLUTION | Freq: Once | RESPIRATORY_TRACT | Status: AC
  Administered 2020-10-06: 5 mg via RESPIRATORY_TRACT
  Filled 2020-10-06: qty 6

## 2020-10-06 MED ORDER — PREDNISONE 20 MG PO TABS: 40.0000 mg | ORAL_TABLET | Freq: Every day | ORAL | 0 refills | Status: AC

## 2020-10-06 NOTE — ED Provider Notes (Signed)
[facility]-EMERGENCY DEPT Provider Note   CSN: [id number] Arrival date & time: 10/06/20  1018     History Chief Complaint  Patient presents with  . Asthma    [name] is a 20 y.o. adult presenting for evaluation of shortness of breath and chest tightness.  Patient states of the past 2 days, he has had increased shortness of breath.  Initially his symptoms resolved with albuterol, but as of last night, they were not getting better with albuterol.  He has been using 2 puffs every 4 hours.  He reports associated chest tightness.  No fevers, cough, nausea, vomiting, Donnell pain.  No sick contacts.  He is vaccinated for COVID, not sure about flu.  He states this feels consistent with previous asthma exacerbations.  He denies recent travel, surgeries, immobilization, history of cancer, history of previous DVT/PE.  He does take testosterone, no other hormones.   HPI     Past Medical History:  Diagnosis Date  . Anxiety   . Asthma   . B12 deficiency   . C. difficile diarrhea   . Female-to-female transgender person   . Gallbladder sludge   . Post concussion syndrome   . Severe depression [facility](HCC)     Patient Active Problem List   Diagnosis Date Noted  . Generalized abdominal pain   . Diarrhea   . Lower abdominal pain   . Nausea and vomiting   . Attention deficit disorder   . Gastroesophageal reflux disease   . Anemia due to vitamin B12 deficiency   . Intractable nausea and vomiting 12/01/2019  . MDD (major depressive disorder), single episode, severe , no psychosis (HCC) 08/18/2017    Past Surgical History:  Procedure Laterality Date  . BIOPSY  12/04/2019   Procedure: BIOPSY;  Surgeon: Shellia Cleverlyirigliano, Vito V, DO;  Location: [facility];  Service: Gastroenterology;;  . brain cyst removal    . CHOLECYSTECTOMY    . COLONOSCOPY WITH PROPOFOL N/A 12/04/2019   Procedure: COLONOSCOPY WITH PROPOFOL;  Surgeon: Shellia Cleverlyirigliano, Vito V, DO;  Location: [facility];   Service: Gastroenterology;  Laterality: N/A;  . ESOPHAGOGASTRODUODENOSCOPY (EGD) WITH PROPOFOL N/A 12/04/2019   Procedure: ESOPHAGOGASTRODUODENOSCOPY (EGD) WITH PROPOFOL;  Surgeon: Shellia Cleverlyirigliano, Vito V, DO;  Location: [facility];  Service: Gastroenterology;  Laterality: N/A;  . LIGAMENT REPAIR Left    radial  . thumb surgery    . ULNAR NERVE REPAIR Left 09/11/2019  . WISDOM TOOTH EXTRACTION       OB History   No obstetric history on file.     Family History  Problem Relation Age of Onset  . Seizures Maternal Aunt   . Heart disease Paternal Aunt   . Diabetes Maternal Grandmother   . Heart disease Maternal Grandmother   . Heart disease Maternal Grandfather   . Deep vein thrombosis Maternal Grandfather   . Hyperlipidemia Maternal Grandfather   . Stomach cancer Paternal Grandfather   . Colon cancer Neg Hx   . Esophageal cancer Neg Hx   . Pancreatic cancer Neg Hx     Social History   Tobacco Use  . Smoking status: Never Smoker  . Smokeless tobacco: Never Used  Vaping Use  . Vaping Use: Every day  . Substances: Nicotine, THC, Flavoring  Substance Use Topics  . Alcohol use: Yes    Comment: occasionally  . Drug use: Yes    Types: Marijuana    Home Medications Prior to Admission medications   Medication Sig Start Date End Date Taking? Authorizing  Provider  predniSONE (DELTASONE) 20 MG tablet Take 2 tablets (40 mg total) by mouth daily for 4 days. 10/07/20 10/11/20 Yes Granvil Djordjevic, PA-C  albuterol (PROVENTIL HFA;VENTOLIN HFA) 108 (90 Base) MCG/ACT inhaler Inhale 2 puffs into the lungs every 6 (six) hours as needed for wheezing or shortness of breath.    [provider]  amitriptyline (ELAVIL) 10 MG tablet Take 10 mg by mouth at bedtime as needed for sleep.  09/01/19   [provider]  cholestyramine (QUESTRAN) 4 g packet Take 1 packet (4 g total) by mouth 2 (two) times daily. 01/03/20   Unk Lightning, PA  cyanocobalamin 1000 MCG tablet Take 1  tablet by mouth daily. 04/04/18   [provider]  dicyclomine (BENTYL) 10 MG capsule Take 1 capsule (10 mg total) by mouth 3 (three) times daily before meals. 12/05/19   Rodolph Bong, MD  ergocalciferol (VITAMIN D2) 1.25 MG (50000 UT) capsule Take 50,000 Units by mouth once a week.    [provider]  naproxen (NAPROSYN) 375 MG tablet Take 1 tablet (375 mg total) by mouth 2 (two) times daily. Patient taking differently: Take 375 mg by mouth 2 (two) times daily as needed for moderate pain.  11/20/19   Derwood Kaplan, MD  omeprazole (PRILOSEC) 20 MG capsule TAKE ONE CAPSULE 20-30 MINUTES PRIOR TO BREAKFAST DAILY 03/07/20   Unk Lightning, PA  omeprazole (PRILOSEC) 40 MG capsule Take 1 capsule (40 mg total) by mouth daily. 12/05/19   Rodolph Bong, MD  ondansetron (ZOFRAN-ODT) 8 MG disintegrating tablet Take 1 tablet (8 mg total) by mouth every 8 (eight) hours as needed. 12/05/19   Rodolph Bong, MD  polycarbophil (FIBERCON) 625 MG tablet Take 1 tablet (625 mg total) by mouth daily. 12/06/19   Rodolph Bong, MD  Rimegepant Sulfate (NURTEC) 75 MG TBDP Take 1 tablet by mouth daily as needed (headache).     [provider]  testosterone cypionate (DEPOTESTOSTERONE CYPIONATE) 200 MG/ML injection Inject 7.75 mLs into the muscle every 14 (fourteen) days.  08/11/18   [provider]  traMADol (ULTRAM) 50 MG tablet Take 1 tablet (50 mg total) by mouth every 6 (six) hours as needed for moderate pain. 12/05/19   Rodolph Bong, MD    Allergies    Bee pollen, Mold extract [trichophyton], Pollen extract, Zyrtec [cetirizine], and Mixed ragweed  Review of Systems   Review of Systems  Respiratory: Positive for chest tightness and shortness of breath.   All other systems reviewed and are negative.   Physical Exam Updated Vital Signs BP 119/84   Pulse 78   Temp 98.2 F (36.8 C) (Oral)   Resp 13   Ht 5\' 9"  (1.753 m)   Wt 86.2 kg   SpO2 100%    BMI 28.06 kg/m   Physical Exam Vitals and nursing note reviewed.  Constitutional:      General: He is not in acute distress.    Appearance: He is well-developed.     Comments: Resting in the bed in NAD, appears anxious  HENT:     Head: Normocephalic and atraumatic.  Eyes:     Conjunctiva/sclera: Conjunctivae normal.     Pupils: Pupils are equal, round, and reactive to light.  Cardiovascular:     Rate and Rhythm: Normal rate and regular rhythm.     Pulses: Normal pulses.  Pulmonary:     Effort: Pulmonary effort is normal. No respiratory distress.     Breath sounds:  Normal breath sounds. No wheezing.     Comments: Speaking in full sentences.  Clear lung sounds on my initial evaluation, which was after albuterol in the ED.  Sats stable on room air.  No respiratory distress Abdominal:     General: There is no distension.     Palpations: Abdomen is soft. There is no mass.     Tenderness: There is no abdominal tenderness. There is no guarding or rebound.  Musculoskeletal:        General: Normal range of motion.     Cervical back: Normal range of motion and neck supple.  Skin:    General: Skin is warm and dry.     Capillary Refill: Capillary refill takes less than 2 seconds.  Neurological:     Mental Status: He is alert and oriented to person, place, and time.     ED Results / Procedures / Treatments   Labs (all labs ordered are listed, but only abnormal results are displayed) Labs Reviewed  CBC - Abnormal; Notable for the following components:      Result Value   RBC 5.56 (*)    Hemoglobin 16.4 (*)    HCT 47.9 (*)    All other components within normal limits  BASIC METABOLIC PANEL - Abnormal; Notable for the following components:   Glucose, Bld 104 (*)    BUN 5 (*)    All other components within normal limits  RESP PANEL BY RT-PCR (FLU A&B, COVID) ARPGX2  D-DIMER, QUANTITATIVE  TROPONIN I (HIGH SENSITIVITY)    EKG EKG Interpretation  Date/Time:  Sunday Oct 06 2020  13:18:43 EDT Ventricular Rate:  107 PR Interval:  135 QRS Duration: 85 QT Interval:  331 QTC Calculation: 442 R Axis:   123 Text Interpretation: Sinus tachycardia Right axis deviation Borderline T abnormalities, anterior leads No old tracing to compare Confirmed by Linwood Dibbles [phone]) on 10/06/2020 1:24:23 PM   Radiology DG Chest 2 View  Result Date: 10/06/2020 CLINICAL DATA:  Shortness of breath. EXAM: CHEST - 2 VIEW COMPARISON:  None. FINDINGS: The heart size and mediastinal contours are within normal limits. Both lungs are clear. The visualized skeletal structures are unremarkable. IMPRESSION: No active cardiopulmonary disease. Electronically Signed   By: Ted Mcalpine M.D.   On: 10/06/2020 11:31    Procedures Procedures   Medications Ordered in ED Medications  albuterol (VENTOLIN HFA) 108 (90 Base) MCG/ACT inhaler 2 puff (2 puffs Inhalation Given 10/06/20 1039)  ipratropium (ATROVENT HFA) inhaler 2 puff (2 puffs Inhalation Given 10/06/20 1129)  predniSONE (DELTASONE) tablet 60 mg (60 mg Oral Given 10/06/20 1129)  albuterol (PROVENTIL) (2.5 MG/3ML) 0.083% nebulizer solution 5 mg (5 mg Nebulization Given 10/06/20 1305)  ketorolac (TORADOL) 15 MG/ML injection 15 mg (15 mg Intravenous Given 10/06/20 1459)    ED Course  I have reviewed the triage vital signs and the nursing notes.  Pertinent labs & imaging results that were available during my care of the patient were reviewed by me and considered in my medical decision making (see chart for details).    MDM Rules/Calculators/A&P                          Patient presented for evaluation of shortness of breath and chest tightness.  On exam, patient appears nontoxic.  Sats are stable.  No wheezing on my exam, however patient is already received albuterol.  Will treat for asthma exacerbation, however also obtain COVID, flu, and chest  x-ray to ensure no other underlying cause.  COVID and flu testing negative.  Chest x-ray viewed and  independently interpreted by me, no pneumonia pneumothorax, effusion.  Patient continues to have chest tightness and shortness of breath, will give breathing treatment.  Breathing treatment helped slightly, but patient continues to have discomfort.  As such, labs including troponin, EKG, dimer obtained.  Labs interpreted by me, overall reassuring.  Low suspicion for ACS, patient does not need repeat troponin.  Dimer is negative, and only risk factor is being on testosterone, I do not believe he needs CTA.  Discussed findings with patient.  Discussed likely asthma exacerbation and continued symptomatic management.  Encourage close follow-up with PCP.  Discussed that at this time, there does not appear to be a life-threatening or emergent event requiring hospitalization.  At this time, patient appears safe for discharge.  Return precautions given.  Patient states he understands and agrees to plan.  Final Clinical Impression(s) / ED Diagnoses Final diagnoses:  Exacerbation of asthma, unspecified asthma severity, unspecified whether persistent    Rx / DC Orders ED Discharge Orders         Ordered    predniSONE (DELTASONE) 20 MG tablet  Daily        10/06/20 1509           Emmerich Cryer, PA-C 10/06/20 1519    Rolan Bucco, MD 10/09/20 [phone]

## 2020-10-06 NOTE — Discharge Instructions (Signed)
Take prednisone as prescribed starting tomorrow. Use albuterol every 4 hours while awake for the next 2 days.  After this, use as needed for shortness of breath, chest tightness, or wheezing.  Follow up with your primary care doctor for recheck of symptoms next week.  Return to the ER with any new, worsening, or concerning symptoms.

## 2020-10-06 NOTE — ED Triage Notes (Signed)
Patient reports a history of asthma and "inhalers not working since yesterday."

## 2020-10-08 ENCOUNTER — Other Ambulatory Visit: Payer: Self-pay | Admitting: Physician Assistant

## 2020-10-08 ENCOUNTER — Other Ambulatory Visit: Payer: Self-pay | Admitting: *Deleted

## 2020-10-08 DIAGNOSIS — R1084 Generalized abdominal pain: Secondary | ICD-10-CM

## 2020-10-08 DIAGNOSIS — R112 Nausea with vomiting, unspecified: Secondary | ICD-10-CM

## 2020-10-08 DIAGNOSIS — A0472 Enterocolitis due to Clostridium difficile, not specified as recurrent: Secondary | ICD-10-CM

## 2020-10-08 NOTE — Patient Outreach (Signed)
[facility]) Care Management  10/08/2020  [name] 2000/10/21 [id number]   Referral received to follow up with member as he contacted [facility] triage line regarding shortness of breath and asthma attack.  He was seen in the ED on 5/29 as well for similar complaints.  Call placed to member, state he is doing better.  Still having intermittent sore throat and cough, denies shortness of breath currently.  Was discharged with inhalers, will pick up from pharmacy today (was closed when discharged).  Does not have follow up appointment with PCP, offered to place conference call to schedule, declined stating he would schedule through My Chart, will have transportation once appointment is scheduled.  Denies any further concerns, agrees to follow up call within the next week.  Kemper Durie, [place], MSN [facility] Care Management  [facility] Manager [phone]

## 2020-10-15 ENCOUNTER — Other Ambulatory Visit: Payer: Self-pay | Admitting: *Deleted

## 2020-10-15 ENCOUNTER — Ambulatory Visit
Admission: RE | Admit: 2020-10-15 | Discharge: 2020-10-15 | Disposition: A | Discharge status: Home / Self Care | Payer: BC Managed Care – PPO | Source: Ambulatory Visit | Attending: Family Medicine | Admitting: Family Medicine

## 2020-10-15 ENCOUNTER — Other Ambulatory Visit: Payer: Self-pay | Admitting: Family Medicine

## 2020-10-15 DIAGNOSIS — R000 Tachycardia, unspecified: Secondary | ICD-10-CM

## 2020-10-15 NOTE — Patient Outreach (Signed)
[facility]) Care Management  10/15/2020  Geraldene Eisel 2000-10-04 [id number]   Outgoing call placed to member to follow up on PCP appointment .  State he was seen by provider yesterday, has new referral for cardiology as they feel recent issues are not related to asthma but more to the heart.  He will have an xray done today and will wait for call from cardiology office to schedule visit.  Denies any further needs, advised to call this care manager with questions.  Will close case at this time.  Kemper Durie, [place], MSN [facility] Care Management  [facility] Manager [phone]

## 2020-11-01 NOTE — Progress Notes (Signed)
Date:  11/04/2020   ID:  [name], DOB Feb 28, 2001, MRN [id number]  PCP:  Cynthia Knapp, MD  Cardiologist:  Cynthia Kras, DO, [facility]  (established care 11/04/2020)  REASON FOR CONSULT: Shortness of breath  REQUESTING PHYSICIAN:  Cynthia Knapp, MD [place],  [place] [id number]  Chief Complaint  Patient presents with   Shortness of Breath   Tachycardia   New Patient (Initial Visit)    HPI  [name] is a 20 y.o. adult who presents to the office with a chief complaint of "shortness of breath and tachycardia." Patient's past medical history and cardiovascular risk factors include: ADHD, anxiety, depression.  He is referred to the office at the request of Cynthia Knapp, MD for evaluation of shortness of breath.  Patient is here with his partner [name].  Patient provides verbal consent with regards to discussing his medical information in her presence.  Since May 2022 he has been experiencing shortness of breath initially thought to be asthma exacerbation.  He had gone to [facility] on Oct 06, 2020 and was noted to have a negative high sensitive troponin x1, D-dimer is within normal limits.  He followed up with his PCP and had additional asthma work-up.  Due to continued symptoms he is now referred to cardiology for further evaluation and management.  Shortness of breath happens on a daily basis, usually brought on by effort related activities such as going up 3 flights of stairs.  Improves with resting.  The intensity, frequency, and duration have remained relatively stable.  There is a pleuritic nature to the discomfort.  And the shortness of breath can also be brought on by hot showers or eating.  Patient states that inhalers have not helped much.  After the symptoms of shortness of breath a week later he started experiencing chest pain.  Located substernally, intensity 2 out of 10, nonradiating, no improving factors.  The symptoms are exacerbated by taking a  deep breath and at times walking.  No significant change in functional status.  The chest discomfort is not positional.  Patient has not had any exposure to sick contacts in May 2022.  He was tested for COVID and flu back in May which both were negative.  He continues to smoke marijuana on a daily basis and has been doing so for the last 2 to 3 years.  He stopped the consumption of energy drinks 2 months ago.  And is also stopped Adderall as of June 2022.  No excessive caffeinated drinks, herbal supplements.  Currently has been on testosterone for the last 2 years and being managed by PCP.  No known reproductive surgery.  Only hormone replacement therapy.  No family history of premature coronary disease or sudden cardiac death.  FUNCTIONAL STATUS: No structured exercise program or daily routine.   ALLERGIES: Allergies  Allergen Reactions   Bee Pollen Anaphylaxis   Mold Extract [Trichophyton] Anaphylaxis and Itching   Pollen Extract Anaphylaxis   Zyrtec [Cetirizine] Anaphylaxis   Mixed Ragweed Itching    MEDICATION LIST PRIOR TO VISIT: Current Meds  Medication Sig   albuterol (PROVENTIL HFA;VENTOLIN HFA) 108 (90 Base) MCG/ACT inhaler Inhale 2 puffs into the lungs every 6 (six) hours as needed for wheezing or shortness of breath.   amitriptyline (ELAVIL) 10 MG tablet Take 10 mg by mouth at bedtime as needed for sleep.    cyanocobalamin 1000 MCG tablet Take 1 tablet by mouth daily.   dicyclomine (BENTYL)  10 MG capsule Take 1 capsule (10 mg total) by mouth 3 (three) times daily before meals.   omeprazole (PRILOSEC) 20 MG capsule TAKE ONE CAPSULE 20-30 MINUTES PRIOR TO BREAKFAST DAILY   ondansetron (ZOFRAN-ODT) 8 MG disintegrating tablet Take 1 tablet (8 mg total) by mouth every 8 (eight) hours as needed.   Rimegepant Sulfate (NURTEC) 75 MG TBDP Take 1 tablet by mouth daily as needed (headache).    testosterone cypionate (DEPOTESTOSTERONE CYPIONATE) 200 MG/ML injection Inject 7.75 mLs  into the muscle every 14 (fourteen) days.    traMADol (ULTRAM) 50 MG tablet Take 1 tablet (50 mg total) by mouth every 6 (six) hours as needed for moderate pain.     PAST MEDICAL HISTORY: Past Medical History:  Diagnosis Date   ADHD    Anxiety    Asthma    B12 deficiency    C. 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    Gallbladder sludge    Post concussion syndrome    Severe depression ([place])     PAST SURGICAL HISTORY: Past Surgical History:  Procedure Laterality Date   BIOPSY  12/04/2019   Procedure: BIOPSY;  Surgeon: Lavena Bullion, DO;  Location: [facility];  Service: Gastroenterology;;   brain cyst removal     CHOLECYSTECTOMY     COLONOSCOPY WITH PROPOFOL N/A 12/04/2019   Procedure: COLONOSCOPY WITH PROPOFOL;  Surgeon: Lavena Bullion, DO;  Location: [facility];  Service: Gastroenterology;  Laterality: N/A;   ESOPHAGOGASTRODUODENOSCOPY (EGD) WITH PROPOFOL N/A 12/04/2019   Procedure: ESOPHAGOGASTRODUODENOSCOPY (EGD) WITH PROPOFOL;  Surgeon: Lavena Bullion, DO;  Location: [facility];  Service: Gastroenterology;  Laterality: N/A;   LIGAMENT REPAIR Left    radial   thumb surgery     ULNAR NERVE REPAIR Left 09/11/2019   WISDOM TOOTH EXTRACTION      FAMILY HISTORY: The patient family history includes Deep vein thrombosis in his maternal grandfather; Diabetes in his maternal grandmother; Heart disease in his maternal grandfather, maternal grandmother, and paternal aunt; Hyperlipidemia in his maternal grandfather; Seizures in his maternal aunt; Stomach cancer in his paternal grandfather.  SOCIAL HISTORY:  The patient  reports that he has never smoked. He has never used smokeless tobacco. He reports current alcohol use. He reports current drug use. Drug: Marijuana.  REVIEW OF SYSTEMS: Review of Systems  Constitutional: Negative for chills and fever.  HENT:  Negative for hoarse voice and nosebleeds.   Eyes:  Negative for discharge, double vision  and pain.  Cardiovascular:  Positive for chest pain and dyspnea on exertion. Negative for claudication, leg swelling, near-syncope, orthopnea, palpitations, paroxysmal nocturnal dyspnea and syncope.  Respiratory:  Negative for hemoptysis and shortness of breath.   Musculoskeletal:  Negative for muscle cramps and myalgias.  Gastrointestinal:  Negative for abdominal pain, constipation, diarrhea, hematemesis, hematochezia, melena, nausea and vomiting.  Neurological:  Positive for dizziness and light-headedness.   PHYSICAL EXAM: Vitals with BMI 11/04/2020 10/06/2020 10/06/2020  Height _0  - -  Weight 204 lbs - -  BMI 91.63 - -  Systolic [phone]  Diastolic 82 84 81  Pulse 70 78 71  Some encounter information is confidential and restricted. Go to Review Flowsheets activity to see all data.    CONSTITUTIONAL: Well-developed and well-nourished. No acute distress.  SKIN: Skin is warm and dry. No rash noted. No cyanosis. No pallor. No jaundice HEAD: Normocephalic and atraumatic.  EYES: No scleral icterus MOUTH/THROAT: Moist oral membranes.  NECK: No JVD present. No thyromegaly  noted. No carotid bruits  LYMPHATIC: No visible cervical adenopathy.  CHEST Normal respiratory effort. No intercostal retractions  LUNGS: Clear to auscultation bilaterally.  No stridor. No wheezes. No rales.  CARDIOVASCULAR: Regular, positive S1-S2, no murmurs rubs or gallops appreciated. ABDOMINAL: No apparent ascites.  EXTREMITIES: No peripheral edema, 2+ dorsalis pedis and posterior tibial pulses. HEMATOLOGIC: No significant bruising NEUROLOGIC: Oriented to person, place, and time. Nonfocal. Normal muscle tone.  PSYCHIATRIC: Normal mood and affect. Normal behavior. Cooperative  CARDIAC DATABASE: EKG: 11/04/2020: Sinus  Rhythm, 64bpm, without underlying injury pattern.  Echocardiogram: No results found for this or any previous visit from the past 1095 days.  Stress Testing: No results found for this or any  previous visit from the past 1095 days.  Heart Catheterization: None  LABORATORY DATA: CBC Latest Ref Rng & Units 10/06/2020 12/05/2019 12/04/2019  WBC 4.0 - 10.5 K/uL 7.7 6.8 5.3  Hemoglobin 12.0 - 15.0 g/dL 16.4(H) 15.0 15.4(H)  Hematocrit 36.0 - 46.0 % 47.9(H) 44.8 46.0  Platelets 150 - 400 K/uL 198 186 184    CMP Latest Ref Rng & Units 10/06/2020 12/05/2019 12/04/2019  Glucose 70 - 99 mg/dL 104(H) 89 92  BUN 6 - 20 mg/dL 5(L) 5(L) <5(L)  Creatinine 0.44 - 1.00 mg/dL 0.75 0.87 0.91  Sodium 135 - 145 mmol/L 139 139 139  Potassium 3.5 - 5.1 mmol/L 3.7 3.4(L) 3.7  Chloride 98 - 111 mmol/L 104 107 108  CO2 22 - 32 mmol/L _0 Calcium 8.9 - 10.3 mg/dL 9.6 8.6(L) 8.5(L)  Total Protein 6.5 - 8.1 g/dL - - -  Total Bilirubin 0.3 - 1.2 mg/dL - - -  Alkaline Phos 38 - 126 U/L - - -  AST 15 - 41 U/L - - -  ALT 0 - 44 U/L - - -    Lipid Panel  No results found for: CHOL, TRIG, HDL, CHOLHDL, VLDL, LDLCALC, LDLDIRECT, LABVLDL  No components found for: NTPROBNP No results for input(s): PROBNP in the last 8760 hours. No results for input(s): TSH in the last 8760 hours.  BMP Recent Labs    12/03/19 0713 12/04/19 0722 12/05/19 0555 10/06/20 1303  NA 139 139 139 139  K 3.7 3.7 3.4* 3.7  CL 107 108 107 104  CO2 _1 GLUCOSE 82 92 89 104*  BUN 6 <5* 5* 5*  CREATININE 0.84 0.91 0.87 0.75  CALCIUM 8.8* 8.5* 8.6* 9.6  GFRNONAA >60 >60 >60 >60  GFRAA >60 >60 >60  --     HEMOGLOBIN A1C No results found for: HGBA1C, MPG  External Labs:  Date Collected: 10/12/2020 , information obtained by PCP Potassium: 4.5 Creatinine 1.06 mg/dL. eGFR: 86 mL/min per 1.73 m WBC 5.7, platelets 215K ESR <2 CRP 0.03 proBNP 7 Hemoglobin: 18.3 g/dL and hematocrit: 54.1 % AST: 20 , ALT: 21 , alkaline phosphatase: 75  TSH: 1.30    IMPRESSION:    ICD-10-CM   1. Shortness of breath  R06.02 EKG 12-Lead    PCV ECHOCARDIOGRAM COMPLETE    PCV CARDIAC STRESS TEST    CANCELED: SARS-COV-2  RNA,(COVID-19) QUAL NAAT    2. Precordial pain  R07.2 PCV ECHOCARDIOGRAM COMPLETE    PCV CARDIAC STRESS TEST    CANCELED: SARS-COV-2 RNA,(COVID-19) QUAL NAAT       RECOMMENDATIONS: [name] is a 20 y.o. adult whose past medical history and cardiac risk factors include: ADHD, anxiety, depression.  Symptoms of precordial pain and shortness of breath appear to  be noncardiac.  He has gone to [facility] for further evaluation and management for the similar symptoms back in May 2022.  High sensitive troponins negative x1 and D-dimer is within normal limits.  EKG today shows normal sinus rhythm without underlying ischemia or injury pattern.  He is also had blood work with PCP which notes normal WBC count, ESR and CRP are within normal limits, no sick contacts, and EKG does not show signs of pericarditis.  He denies any fevers or chills.  Less likely to be pericarditis.  The shared decision was to proceed with an echocardiogram to evaluate for structural heart disease.  And we will also check exercise treadmill stress test to evaluate for exercise-induced ischemia/arrhythmias.  Looking up outside labs provided by primary care provider as a part of this consultation patient's hemoglobin and hematocrit appear to be elevated.  This may be secondary to him being on testosterone.  I have asked him to look into this a bit more in detail with his PCP to see if medications need to be further titrated or be seen by a reproductive specialist.  I will see the patient back in follow-up in 4 weeks to review test results and reevaluation of symptoms.  FINAL MEDICATION LIST END OF ENCOUNTER: No orders of the defined types were placed in this encounter.   Medications Discontinued During This Encounter  Medication Reason   cholestyramine (QUESTRAN) 4 g packet Error   ergocalciferol (VITAMIN D2) 1.25 MG (50000 UT) capsule Error   naproxen (NAPROSYN) 375 MG tablet Error   omeprazole (PRILOSEC) 40 MG capsule  Change in therapy   polycarbophil (FIBERCON) 625 MG tablet Error     Current Outpatient Medications:    albuterol (PROVENTIL HFA;VENTOLIN HFA) 108 (90 Base) MCG/ACT inhaler, Inhale 2 puffs into the lungs every 6 (six) hours as needed for wheezing or shortness of breath., Disp: , Rfl:    amitriptyline (ELAVIL) 10 MG tablet, Take 10 mg by mouth at bedtime as needed for sleep. , Disp: , Rfl:    cyanocobalamin 1000 MCG tablet, Take 1 tablet by mouth daily., Disp: , Rfl:    dicyclomine (BENTYL) 10 MG capsule, Take 1 capsule (10 mg total) by mouth 3 (three) times daily before meals., Disp: 90 capsule, Rfl: 0   omeprazole (PRILOSEC) 20 MG capsule, TAKE ONE CAPSULE 20-30 MINUTES PRIOR TO BREAKFAST DAILY, Disp: 90 capsule, Rfl: 1   ondansetron (ZOFRAN-ODT) 8 MG disintegrating tablet, Take 1 tablet (8 mg total) by mouth every 8 (eight) hours as needed., Disp: 20 tablet, Rfl: 0   Rimegepant Sulfate (NURTEC) 75 MG TBDP, Take 1 tablet by mouth daily as needed (headache). , Disp: , Rfl:    testosterone cypionate (DEPOTESTOSTERONE CYPIONATE) 200 MG/ML injection, Inject 7.75 mLs into the muscle every 14 (fourteen) days. , Disp: , Rfl:    traMADol (ULTRAM) 50 MG tablet, Take 1 tablet (50 mg total) by mouth every 6 (six) hours as needed for moderate pain., Disp: 15 tablet, Rfl: 0   ADDERALL XR 20 MG 24 hr capsule, Take 20 mg by mouth every morning. (Patient not taking: Reported on 11/04/2020), Disp: , Rfl:   Orders Placed This Encounter  Procedures   PCV CARDIAC STRESS TEST   EKG 12-Lead   PCV ECHOCARDIOGRAM COMPLETE    There are no Patient Instructions on file for this visit.   --Continue cardiac medications as reconciled in final medication list. --Return in about 4 weeks (around 12/02/2020) for Follow up, Review test results. Or sooner  if needed. --Continue follow-up with your primary care physician regarding the management of your other chronic comorbid conditions.  Patient's questions and concerns were  addressed to his satisfaction. He voices understanding of the instructions provided during this encounter.   This note was created using a voice recognition software as a result there may be grammatical errors inadvertently enclosed that do not reflect the nature of this encounter. Every attempt is made to correct such errors.  [name], [place], [facility]  Pager: [phone] Office: [phone]

## 2020-11-04 ENCOUNTER — Other Ambulatory Visit: Payer: Self-pay

## 2020-11-04 ENCOUNTER — Ambulatory Visit: Payer: BC Managed Care – PPO | Admitting: Cardiology

## 2020-11-04 ENCOUNTER — Encounter: Payer: Self-pay | Admitting: Cardiology

## 2020-11-04 VITALS — BP 127/82 | HR 70 | Temp 98.6°F | Resp 16 | Ht 69.0 in | Wt 204.0 lb

## 2020-11-04 DIAGNOSIS — R072 Precordial pain: Secondary | ICD-10-CM

## 2020-11-04 DIAGNOSIS — R0602 Shortness of breath: Secondary | ICD-10-CM

## 2020-11-12 ENCOUNTER — Other Ambulatory Visit: Payer: Self-pay

## 2020-11-12 ENCOUNTER — Ambulatory Visit: Payer: BC Managed Care – PPO

## 2020-11-12 ENCOUNTER — Ambulatory Visit: Payer: BC Managed Care – PPO | Admitting: Cardiology

## 2020-11-12 DIAGNOSIS — R0602 Shortness of breath: Secondary | ICD-10-CM

## 2020-11-12 DIAGNOSIS — R072 Precordial pain: Secondary | ICD-10-CM

## 2020-11-15 ENCOUNTER — Other Ambulatory Visit: Payer: Self-pay

## 2020-11-15 ENCOUNTER — Ambulatory Visit: Payer: BC Managed Care – PPO

## 2020-11-15 DIAGNOSIS — R072 Precordial pain: Secondary | ICD-10-CM

## 2020-11-15 DIAGNOSIS — R0602 Shortness of breath: Secondary | ICD-10-CM

## 2020-12-02 ENCOUNTER — Ambulatory Visit: Payer: BC Managed Care – PPO | Admitting: Cardiology

## 2020-12-02 ENCOUNTER — Encounter: Payer: Self-pay | Admitting: Cardiology

## 2020-12-02 ENCOUNTER — Other Ambulatory Visit: Payer: Self-pay

## 2020-12-02 VITALS — BP 120/80 | HR 60 | Resp 16 | Ht 69.0 in | Wt 206.0 lb

## 2020-12-02 DIAGNOSIS — Z712 Person consulting for explanation of examination or test findings: Secondary | ICD-10-CM

## 2020-12-02 DIAGNOSIS — R0602 Shortness of breath: Secondary | ICD-10-CM

## 2020-12-02 DIAGNOSIS — R072 Precordial pain: Secondary | ICD-10-CM

## 2020-12-02 NOTE — Progress Notes (Signed)
Date:  12/02/2020   ID:  [name], DOB Jun 11, 2000, MRN [id number]  PCP:  [name], MD  Cardiologist:  Rex Kras, DO, [facility]  (established care 11/04/2020)  Date: 12/02/20 Last Office Visit: 11/04/2020  Chief Complaint  Patient presents with   Shortness of Breath   Results   Follow-up    HPI  [name] is a 20 y.o. adult who presents to the office with a chief complaint of "Reevaluation of shortness of breath and review test results." Patient's past medical history and cardiovascular risk factors include: ADHD, anxiety, depression.  He is referred to the office at the request of [name], MD for evaluation of shortness of breath.  Patient is here with his partner [name].  Patient provides verbal consent with regards to discussing his medical information in her presence.  Patient has been experiencing shortness of breath since May 2022 initially thought to be asthma exacerbation.  Also visited [facility] for similar symptoms and was noted to have a negative high sensitive troponin, D-dimer within normal limits.  Later referred to cardiology for further evaluation and management.  Since last office visit patient states that his shortness of breath remains relatively stable without any resolution.  He also has some pleuritic nature to his shortness of breath and brought on by hot showers and eating heavy meals as well.  No change after using inhaler therapy.  He continues to smoke marijuana on a daily basis.  At the last office visit the shared decision was to proceed with an echocardiogram and exercise treadmill stress test.  The echocardiogram notes preserved LVEF without any significant valvular heart disease and exercise treadmill stress test overall a low risk study at the workload performed.  No family history of premature coronary disease or sudden cardiac death.  FUNCTIONAL STATUS: No structured exercise program or daily routine.    ALLERGIES: Allergies  Allergen Reactions   Bee Pollen Anaphylaxis   Mold Extract [Trichophyton] Anaphylaxis and Itching   Pollen Extract Anaphylaxis   Zyrtec [Cetirizine] Anaphylaxis   Mixed Ragweed Itching    MEDICATION LIST PRIOR TO VISIT: Current Meds  Medication Sig   ADDERALL XR 20 MG 24 hr capsule Take 20 mg by mouth every morning.   albuterol (PROVENTIL HFA;VENTOLIN HFA) 108 (90 Base) MCG/ACT inhaler Inhale 2 puffs into the lungs every 6 (six) hours as needed for wheezing or shortness of breath.   amitriptyline (ELAVIL) 10 MG tablet Take 10 mg by mouth at bedtime as needed for sleep.    cyanocobalamin 1000 MCG tablet Take 1 tablet by mouth daily.   dicyclomine (BENTYL) 10 MG capsule Take 1 capsule (10 mg total) by mouth 3 (three) times daily before meals.   omeprazole (PRILOSEC) 20 MG capsule TAKE ONE CAPSULE 20-30 MINUTES PRIOR TO BREAKFAST DAILY   ondansetron (ZOFRAN-ODT) 8 MG disintegrating tablet Take 1 tablet (8 mg total) by mouth every 8 (eight) hours as needed.   Rimegepant Sulfate (NURTEC) 75 MG TBDP Take 1 tablet by mouth daily as needed (headache).    testosterone cypionate (DEPOTESTOSTERONE CYPIONATE) 200 MG/ML injection Inject 7.75 mLs into the muscle every 14 (fourteen) days.    traMADol (ULTRAM) 50 MG tablet Take 1 tablet (50 mg total) by mouth every 6 (six) hours as needed for moderate pain.     PAST MEDICAL HISTORY: Past Medical History:  Diagnosis Date   ADHD    Anxiety    Asthma    B12 deficiency  C. 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    Gallbladder sludge    Post concussion syndrome    Severe depression ([place])     PAST SURGICAL HISTORY: Past Surgical History:  Procedure Laterality Date   BIOPSY  12/04/2019   Procedure: BIOPSY;  Surgeon: Lavena Bullion, DO;  Location: [facility];  Service: Gastroenterology;;   brain cyst removal     CHOLECYSTECTOMY     COLONOSCOPY WITH PROPOFOL N/A 12/04/2019   Procedure:  COLONOSCOPY WITH PROPOFOL;  Surgeon: Lavena Bullion, DO;  Location: [facility];  Service: Gastroenterology;  Laterality: N/A;   ESOPHAGOGASTRODUODENOSCOPY (EGD) WITH PROPOFOL N/A 12/04/2019   Procedure: ESOPHAGOGASTRODUODENOSCOPY (EGD) WITH PROPOFOL;  Surgeon: Lavena Bullion, DO;  Location: [facility];  Service: Gastroenterology;  Laterality: N/A;   LIGAMENT REPAIR Left    radial   thumb surgery     ULNAR NERVE REPAIR Left 09/11/2019   WISDOM TOOTH EXTRACTION      FAMILY HISTORY: The patient family history includes Deep vein thrombosis in his maternal grandfather; Diabetes in his maternal grandmother; Heart disease in his maternal grandfather, maternal grandmother, and paternal aunt; Hyperlipidemia in his maternal grandfather; Seizures in his maternal aunt; Stomach cancer in his paternal grandfather.  SOCIAL HISTORY:  The patient  reports that he has never smoked. He has never used smokeless tobacco. He reports current alcohol use. He reports current drug use. Drug: Marijuana.  REVIEW OF SYSTEMS: Review of Systems  Constitutional: Negative for chills and fever.  HENT:  Negative for hoarse voice and nosebleeds.   Eyes:  Negative for discharge, double vision and pain.  Cardiovascular:  Positive for dyspnea on exertion. Negative for chest pain, claudication, leg swelling, near-syncope, orthopnea, palpitations, paroxysmal nocturnal dyspnea and syncope.  Respiratory:  Negative for hemoptysis and shortness of breath.   Musculoskeletal:  Negative for muscle cramps and myalgias.  Gastrointestinal:  Negative for abdominal pain, constipation, diarrhea, hematemesis, hematochezia, melena, nausea and vomiting.  Neurological:  Positive for dizziness and light-headedness.   PHYSICAL EXAM: Vitals with BMI 12/02/2020 11/04/2020 10/06/2020  Height _0  _1  -  Weight 206 lbs 204 lbs -  BMI 50.35 46.56 -  Systolic 812 751 700  Diastolic 80 82 84  Pulse 60 70 78  Some encounter information  is confidential and restricted. Go to Review Flowsheets activity to see all data.    CONSTITUTIONAL: Well-developed and well-nourished. No acute distress.  SKIN: Skin is warm and dry. No rash noted. No cyanosis. No pallor. No jaundice HEAD: Normocephalic and atraumatic.  EYES: No scleral icterus MOUTH/THROAT: Moist oral membranes.  NECK: No JVD present. No thyromegaly noted. No carotid bruits  LYMPHATIC: No visible cervical adenopathy.  CHEST Normal respiratory effort. No intercostal retractions  LUNGS: Clear to auscultation bilaterally.  No stridor. No wheezes. No rales.  CARDIOVASCULAR: Regular, positive S1-S2, no murmurs rubs or gallops appreciated. ABDOMINAL: No apparent ascites.  EXTREMITIES: No peripheral edema, 2+ dorsalis pedis and posterior tibial pulses. HEMATOLOGIC: No significant bruising NEUROLOGIC: Oriented to person, place, and time. Nonfocal. Normal muscle tone.  PSYCHIATRIC: Normal mood and affect. Normal behavior. Cooperative  CARDIAC DATABASE: EKG: 11/04/2020: Sinus  Rhythm, 64bpm, without underlying injury pattern.  Echocardiogram: 11/12/2020: Normal LV systolic function with visual EF 55-60%. Left ventricle cavity is normal in size. Normal global wall motion. Normal diastolic filling pattern, normal LAP. Mild tricuspid regurgitation. No evidence of pulmonary hypertension. Mild pulmonic regurgitation. No prior study for comparison.  Stress Testing: Exercise treadmill stress test 11/15/2020: Functional  status: Good. Chest pain: No. Reason for stopping exercise: Fatigue/weakness. Hypertensive response to exercise: No. Exercise time 8 minutes 22 seconds on Bruce protocol, achieved 10.16 METS, and 84% of age-predicted maximum heart rate. Submaximal stress test: Stress ECG negative for ischemia at the current workload. Low risk study  Heart Catheterization: None  LABORATORY DATA: CBC Latest Ref Rng & Units 10/06/2020 12/05/2019 12/04/2019  WBC 4.0 - 10.5 K/uL  7.7 6.8 5.3  Hemoglobin 12.0 - 15.0 g/dL 16.4(H) 15.0 15.4(H)  Hematocrit 36.0 - 46.0 % 47.9(H) 44.8 46.0  Platelets 150 - 400 K/uL 198 186 184    CMP Latest Ref Rng & Units 10/06/2020 12/05/2019 12/04/2019  Glucose 70 - 99 mg/dL 104(H) 89 92  BUN 6 - 20 mg/dL 5(L) 5(L) <5(L)  Creatinine 0.44 - 1.00 mg/dL 0.75 0.87 0.91  Sodium 135 - 145 mmol/L 139 139 139  Potassium 3.5 - 5.1 mmol/L 3.7 3.4(L) 3.7  Chloride 98 - 111 mmol/L 104 107 108  CO2 22 - 32 mmol/L _0 Calcium 8.9 - 10.3 mg/dL 9.6 8.6(L) 8.5(L)  Total Protein 6.5 - 8.1 g/dL - - -  Total Bilirubin 0.3 - 1.2 mg/dL - - -  Alkaline Phos 38 - 126 U/L - - -  AST 15 - 41 U/L - - -  ALT 0 - 44 U/L - - -    Lipid Panel  No results found for: CHOL, TRIG, HDL, CHOLHDL, VLDL, LDLCALC, LDLDIRECT, LABVLDL  No components found for: NTPROBNP No results for input(s): PROBNP in the last 8760 hours. No results for input(s): TSH in the last 8760 hours.  BMP Recent Labs    12/04/19 0722 12/05/19 0555 10/06/20 1303  NA 139 139 139  K 3.7 3.4* 3.7  CL 108 107 104  CO2 _1 GLUCOSE 92 89 104*  BUN <5* 5* 5*  CREATININE 0.91 0.87 0.75  CALCIUM 8.5* 8.6* 9.6  GFRNONAA >60 >60 >60  GFRAA >60 >60  --     HEMOGLOBIN A1C No results found for: HGBA1C, MPG  External Labs:  Date Collected: 10/12/2020 , information obtained by PCP Potassium: 4.5 Creatinine 1.06 mg/dL. eGFR: 86 mL/min per 1.73 m WBC 5.7, platelets 215K ESR <2 CRP 0.03 proBNP 7 Hemoglobin: 18.3 g/dL and hematocrit: 54.1 % AST: 20 , ALT: 21 , alkaline phosphatase: 75  TSH: 1.30    IMPRESSION:    ICD-10-CM   1. Shortness of breath  R06.02     2. Precordial pain  R07.2     3. Encounter to discuss test results  Z71.2        RECOMMENDATIONS: [name] is a 20 y.o. adult whose past medical history and cardiac risk factors include: ADHD, anxiety, depression.  Patient has had a appropriate cardiovascular work-up given his symptoms of shortness of  breath and noncardiac chest pain.  Reviewed the results of the echocardiogram and stress test with the patient and his significant other in today's office visit.  No additional cardiovascular testing needed at this time focus on noncardiac causes of his symptoms.  However, if the symptoms were to continue he is asked to follow-up for additional evaluation and management.  Recommend coronary CTA at that time if his symptoms were to continue or are more suggestive of cardiac etiology.    With the patient and his significant other were agreeable with the plan of care and questions and concerns addressed to their satisfaction.  I will see the patient on as needed basis.  FINAL MEDICATION LIST END OF ENCOUNTER: No orders of the defined types were placed in this encounter.   There are no discontinued medications.    Current Outpatient Medications:    ADDERALL XR 20 MG 24 hr capsule, Take 20 mg by mouth every morning., Disp: , Rfl:    albuterol (PROVENTIL HFA;VENTOLIN HFA) 108 (90 Base) MCG/ACT inhaler, Inhale 2 puffs into the lungs every 6 (six) hours as needed for wheezing or shortness of breath., Disp: , Rfl:    amitriptyline (ELAVIL) 10 MG tablet, Take 10 mg by mouth at bedtime as needed for sleep. , Disp: , Rfl:    cyanocobalamin 1000 MCG tablet, Take 1 tablet by mouth daily., Disp: , Rfl:    dicyclomine (BENTYL) 10 MG capsule, Take 1 capsule (10 mg total) by mouth 3 (three) times daily before meals., Disp: 90 capsule, Rfl: 0   omeprazole (PRILOSEC) 20 MG capsule, TAKE ONE CAPSULE 20-30 MINUTES PRIOR TO BREAKFAST DAILY, Disp: 90 capsule, Rfl: 1   ondansetron (ZOFRAN-ODT) 8 MG disintegrating tablet, Take 1 tablet (8 mg total) by mouth every 8 (eight) hours as needed., Disp: 20 tablet, Rfl: 0   Rimegepant Sulfate (NURTEC) 75 MG TBDP, Take 1 tablet by mouth daily as needed (headache). , Disp: , Rfl:    testosterone cypionate (DEPOTESTOSTERONE CYPIONATE) 200 MG/ML injection, Inject 7.75 mLs into  the muscle every 14 (fourteen) days. , Disp: , Rfl:    traMADol (ULTRAM) 50 MG tablet, Take 1 tablet (50 mg total) by mouth every 6 (six) hours as needed for moderate pain., Disp: 15 tablet, Rfl: 0  No orders of the defined types were placed in this encounter.   There are no Patient Instructions on file for this visit.   --Continue cardiac medications as reconciled in final medication list. --Return if symptoms worsen or fail to improve. Or sooner if needed. --Continue follow-up with your primary care physician regarding the management of your other chronic comorbid conditions.  Patient's questions and concerns were addressed to his satisfaction. He voices understanding of the instructions provided during this encounter.   This note was created using a voice recognition software as a result there may be grammatical errors inadvertently enclosed that do not reflect the nature of this encounter. Every attempt is made to correct such errors.  Rex Kras, [place], [facility]  Pager: [phone] Office: [phone]

## 2020-12-11 ENCOUNTER — Emergency Department (HOSPITAL_COMMUNITY): Payer: BC Managed Care – PPO

## 2020-12-11 ENCOUNTER — Other Ambulatory Visit: Payer: Self-pay

## 2020-12-11 ENCOUNTER — Emergency Department (HOSPITAL_COMMUNITY)
Admission: EM | Admit: 2020-12-11 | Discharge: 2020-12-11 | Disposition: A | Discharge status: Home / Self Care | Payer: BC Managed Care – PPO | Attending: Emergency Medicine | Admitting: Emergency Medicine

## 2020-12-11 ENCOUNTER — Encounter (HOSPITAL_COMMUNITY): Payer: Self-pay

## 2020-12-11 DIAGNOSIS — R1084 Generalized abdominal pain: Secondary | ICD-10-CM

## 2020-12-11 DIAGNOSIS — Z20822 Contact with and (suspected) exposure to covid-19: Secondary | ICD-10-CM | POA: Diagnosis not present

## 2020-12-11 DIAGNOSIS — J45909 Unspecified asthma, uncomplicated: Secondary | ICD-10-CM | POA: Insufficient documentation

## 2020-12-11 DIAGNOSIS — R112 Nausea with vomiting, unspecified: Secondary | ICD-10-CM | POA: Diagnosis not present

## 2020-12-11 DIAGNOSIS — R109 Unspecified abdominal pain: Secondary | ICD-10-CM | POA: Diagnosis not present

## 2020-12-11 DIAGNOSIS — R197 Diarrhea, unspecified: Secondary | ICD-10-CM | POA: Diagnosis not present

## 2020-12-11 LAB — CBC WITH DIFFERENTIAL/PLATELET
Abs Immature Granulocytes: 0.01 10*3/uL (ref 0.00–0.07)
Basophils Absolute: 0 10*3/uL (ref 0.0–0.1)
Basophils Relative: 0 %
Eosinophils Absolute: 0.2 10*3/uL (ref 0.0–0.5)
Eosinophils Relative: 2 %
HCT: 46.7 % — ABNORMAL HIGH (ref 36.0–46.0)
Hemoglobin: 16.4 g/dL — ABNORMAL HIGH (ref 12.0–15.0)
Immature Granulocytes: 0 %
Lymphocytes Relative: 36 %
Lymphs Abs: 2.9 10*3/uL (ref 0.7–4.0)
MCH: 29.9 pg (ref 26.0–34.0)
MCHC: 35.1 g/dL (ref 30.0–36.0)
MCV: 85.1 fL (ref 80.0–100.0)
Monocytes Absolute: 0.6 10*3/uL (ref 0.1–1.0)
Monocytes Relative: 7 %
Neutro Abs: 4.3 10*3/uL (ref 1.7–7.7)
Neutrophils Relative %: 55 %
Platelets: 196 10*3/uL (ref 150–400)
RBC: 5.49 MIL/uL — ABNORMAL HIGH (ref 3.87–5.11)
RDW: 11.8 % (ref 11.5–15.5)
WBC: 7.9 10*3/uL (ref 4.0–10.5)
nRBC: 0 % (ref 0.0–0.2)

## 2020-12-11 LAB — HCG, QUANTITATIVE, PREGNANCY: hCG, Beta Chain, Quant, S: <1 m[IU]/mL (ref <5)

## 2020-12-11 LAB — COMPREHENSIVE METABOLIC PANEL
ALT: 35 U/L (ref 0–44)
AST: 22 U/L (ref 15–41)
Albumin: 4.5 g/dL (ref 3.5–5.0)
Alkaline Phosphatase: 61 U/L (ref 38–126)
Anion gap: 8 (ref 5–15)
BUN: 13 mg/dL (ref 6–20)
CO2: 24 mmol/L (ref 22–32)
Calcium: 9.2 mg/dL (ref 8.9–10.3)
Chloride: 106 mmol/L (ref 98–111)
Creatinine, Ser: 0.89 mg/dL (ref 0.44–1.00)
GFR, Estimated: >60 mL/min (ref >60)
Glucose, Bld: 96 mg/dL (ref 70–99)
Potassium: 4 mmol/L (ref 3.5–5.1)
Sodium: 138 mmol/L (ref 135–145)
Total Bilirubin: 0.5 mg/dL (ref 0.3–1.2)
Total Protein: 7.6 g/dL (ref 6.5–8.1)

## 2020-12-11 LAB — RESP PANEL BY RT-PCR (FLU A&B, COVID) ARPGX2
Influenza A by PCR: NEGATIVE
Influenza B by PCR: NEGATIVE
SARS Coronavirus 2 by RT PCR: NEGATIVE

## 2020-12-11 LAB — LIPASE, BLOOD: Lipase: 52 U/L — ABNORMAL HIGH (ref 11–51)

## 2020-12-11 MED ORDER — MORPHINE SULFATE (PF) 4 MG/ML IV SOLN
4.0000 mg | Freq: Once | INTRAVENOUS | Status: AC
  Administered 2020-12-11: 4 mg via INTRAVENOUS
  Filled 2020-12-11: qty 1

## 2020-12-11 MED ORDER — ACETAMINOPHEN 325 MG PO TABS
650.0000 mg | ORAL_TABLET | Freq: Once | ORAL | Status: AC
  Administered 2020-12-11: 650 mg via ORAL
  Filled 2020-12-11: qty 2

## 2020-12-11 MED ORDER — SODIUM CHLORIDE 0.9 % IV BOLUS
1000.0000 mL | Freq: Once | INTRAVENOUS | Status: AC
  Administered 2020-12-11: 1000 mL via INTRAVENOUS

## 2020-12-11 MED ORDER — ONDANSETRON HCL 4 MG/2ML IJ SOLN
4.0000 mg | Freq: Once | INTRAMUSCULAR | Status: AC
  Administered 2020-12-11: 4 mg via INTRAVENOUS
  Filled 2020-12-11: qty 2

## 2020-12-11 MED ORDER — DICYCLOMINE HCL 10 MG PO CAPS: 10.0000 mg | ORAL_CAPSULE | Freq: Three times a day (TID) | ORAL | 0 refills | Status: DC

## 2020-12-11 MED ORDER — IOHEXOL 350 MG/ML SOLN
80.0000 mL | Freq: Once | INTRAVENOUS | Status: AC | PRN
  Administered 2020-12-11: 80 mL via INTRAVENOUS

## 2020-12-11 MED ORDER — DICYCLOMINE HCL 10 MG PO CAPS
10.0000 mg | ORAL_CAPSULE | Freq: Once | ORAL | Status: AC
  Administered 2020-12-11: 10 mg via ORAL
  Filled 2020-12-11: qty 1

## 2020-12-11 MED ORDER — OMEPRAZOLE 20 MG PO CPDR: DELAYED_RELEASE_CAPSULE | ORAL | 1 refills | Status: DC

## 2020-12-11 MED ORDER — ONDANSETRON 8 MG PO TBDP: 8.0000 mg | ORAL_TABLET | Freq: Three times a day (TID) | ORAL | 0 refills | Status: AC | PRN

## 2020-12-11 NOTE — ED Provider Notes (Signed)
Pt signed out by Dr. Wilkie Aye pending symptomatic relief.  Pt continued to have abd pain, so additional IVFs and meds were ordered.  He still had pain, so a CT was done.  CT nl.  Pt is feeling better now.  He is stable for d/c and instructed to f/u with pcp and with GI.  Return if worse.   Jacalyn Lefevre, MD 12/11/20 1059

## 2020-12-11 NOTE — ED Notes (Signed)
Spoke to Latta in lab to add on quant.

## 2020-12-11 NOTE — ED Triage Notes (Signed)
Pt reports painful abdominal cramping along with nausea, vomiting, and diarrhea over the past few days. Pt states that they have been having stomach issues over the past year after a CDIFF infection.

## 2020-12-11 NOTE — ED Provider Notes (Signed)
[facility]-EMERGENCY DEPT Provider Note   CSN: [id number] Arrival date & time: 12/11/20  0327     History Chief Complaint  Patient presents with   Abdominal Pain   Nausea   Emesis   Diarrhea    [name] is a 20 y.o. adult.  HPI    Is a 20 year old transgender female with a history of current chronic abdominal pain, C. difficile infection, cholecystectomy who presents with abdominal pain, nausea, vomiting, and diarrhea.  Patient reports he had difficulty sleeping last night.  He got up to take a shower.  He began to have crampy abdominal discomfort.  He reports nonbilious, nonbloody emesis and nonbloody diarrhea.  He reports that ever since having C. difficile 1 year ago, he has had chronic abdominal issues.  He takes Bentyl and Zofran as well as omeprazole for his symptoms.  He has not had any fevers.  No recent sick contacts or COVID exposures.  Past Medical History:  Diagnosis Date   ADHD    Anxiety    Asthma    B12 deficiency    C. 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    Gallbladder sludge    Post concussion syndrome    Severe depression ([facility])     Patient Active Problem List   Diagnosis Date Noted   Generalized abdominal pain    Diarrhea    Lower abdominal pain    Nausea and vomiting    Attention deficit disorder    Gastroesophageal reflux disease    Anemia due to vitamin B12 deficiency    Intractable nausea and vomiting 12/01/2019   MDD (major depressive disorder), single episode, severe , no psychosis ([facility]) 08/18/2017    Past Surgical History:  Procedure Laterality Date   BIOPSY  12/04/2019   Procedure: BIOPSY;  Surgeon: Shellia Cleverly, DO;  Location: [facility];  Service: Gastroenterology;;   brain cyst removal     CHOLECYSTECTOMY     COLONOSCOPY WITH PROPOFOL N/A 12/04/2019   Procedure: COLONOSCOPY WITH PROPOFOL;  Surgeon: Shellia Cleverly, DO;  Location: [facility];  Service: Gastroenterology;   Laterality: N/A;   ESOPHAGOGASTRODUODENOSCOPY (EGD) WITH PROPOFOL N/A 12/04/2019   Procedure: ESOPHAGOGASTRODUODENOSCOPY (EGD) WITH PROPOFOL;  Surgeon: Shellia Cleverly, DO;  Location: [facility];  Service: Gastroenterology;  Laterality: N/A;   LIGAMENT REPAIR Left    radial   thumb surgery     ULNAR NERVE REPAIR Left 09/11/2019   WISDOM TOOTH EXTRACTION       OB History   No obstetric history on file.     Family History  Problem Relation Age of Onset   Seizures Maternal Aunt    Heart disease Paternal Aunt    Diabetes Maternal Grandmother    Heart disease Maternal Grandmother    Heart disease Maternal Grandfather    Deep vein thrombosis Maternal Grandfather    Hyperlipidemia Maternal Grandfather    Stomach cancer Paternal Grandfather    Colon cancer Neg Hx    Esophageal cancer Neg Hx    Pancreatic cancer Neg Hx     Social History   Tobacco Use   Smoking status: Never   Smokeless tobacco: Never  Vaping Use   Vaping Use: Every day   Substances: Nicotine, THC, Flavoring  Substance Use Topics   Alcohol use: Yes    Comment: occasionally   Drug use: Yes    Types: Marijuana    Home Medications Prior to Admission medications   Medication Sig Start Date End  Date Taking? Authorizing Provider  ADDERALL XR 20 MG 24 hr capsule Take 20 mg by mouth every morning. 08/29/20  Yes [provider]  albuterol (PROVENTIL HFA;VENTOLIN HFA) 108 (90 Base) MCG/ACT inhaler Inhale 2 puffs into the lungs every 6 (six) hours as needed for wheezing or shortness of breath.   Yes [provider]  cyanocobalamin 1000 MCG tablet Take 1 tablet by mouth daily. 04/04/18  Yes [provider]  dicyclomine (BENTYL) 10 MG capsule Take 1 capsule (10 mg total) by mouth 3 (three) times daily before meals. 12/05/19  Yes Rodolph Bong, MD  omeprazole (PRILOSEC) 20 MG capsule TAKE ONE CAPSULE 20-30 MINUTES PRIOR TO BREAKFAST DAILY 03/07/20  Yes Unk Lightning, PA   ondansetron (ZOFRAN-ODT) 8 MG disintegrating tablet Take 1 tablet (8 mg total) by mouth every 8 (eight) hours as needed. 12/05/19  Yes Rodolph Bong, MD  Rimegepant Sulfate (NURTEC) 75 MG TBDP Take 1 tablet by mouth daily as needed (headache).    Yes [provider]  SYMBICORT 160-4.5 MCG/ACT inhaler Inhale 2 puffs into the lungs 2 (two) times daily as needed. 10/01/20  Yes [provider]  testosterone cypionate (DEPOTESTOSTERONE CYPIONATE) 200 MG/ML injection Inject 7.75 mLs into the muscle every 14 (fourteen) days.  08/11/18  Yes [provider]  traMADol (ULTRAM) 50 MG tablet Take 1 tablet (50 mg total) by mouth every 6 (six) hours as needed for moderate pain. 12/05/19  Yes Rodolph Bong, MD    Allergies    Bee pollen, Mold extract [trichophyton], Pollen extract, Zyrtec [cetirizine], and Mixed ragweed  Review of Systems   Review of Systems  Constitutional:  Negative for fever.  Respiratory:  Negative for shortness of breath.   Cardiovascular:  Negative for chest pain.  Gastrointestinal:  Positive for abdominal pain, diarrhea, nausea and vomiting.  Genitourinary:  Negative for flank pain.  All other systems reviewed and are negative.  Physical Exam Updated Vital Signs BP (!) 118/93   Pulse (!) 57   Temp 98.1 F (36.7 C) (Oral)   Resp 14   SpO2 99%   Physical Exam Vitals and nursing note reviewed.  Constitutional:      Appearance: He is well-developed. He is not ill-appearing.  HENT:     Head: Normocephalic and atraumatic.     Mouth/Throat:     Mouth: Mucous membranes are moist.  Eyes:     Pupils: Pupils are equal, round, and reactive to light.  Cardiovascular:     Rate and Rhythm: Normal rate and regular rhythm.     Heart sounds: Normal heart sounds. No murmur heard. Pulmonary:     Effort: Pulmonary effort is normal. No respiratory distress.     Breath sounds: Normal breath sounds. No wheezing.  Abdominal:     General: Bowel sounds are  normal.     Palpations: Abdomen is soft.     Tenderness: There is no abdominal tenderness. There is no rebound.  Musculoskeletal:     Cervical back: Neck supple.  Lymphadenopathy:     Cervical: No cervical adenopathy.  Skin:    General: Skin is warm and dry.  Neurological:     Mental Status: He is alert and oriented to person, place, and time.  Psychiatric:        Mood and Affect: Mood normal.    ED Results / Procedures / Treatments   Labs (all labs ordered are listed, but only abnormal results are displayed) Labs Reviewed  CBC WITH DIFFERENTIAL/PLATELET -  Abnormal; Notable for the following components:      Result Value   RBC 5.49 (*)    Hemoglobin 16.4 (*)    HCT 46.7 (*)    All other components within normal limits  LIPASE, BLOOD - Abnormal; Notable for the following components:   Lipase 52 (*)    All other components within normal limits  RESP PANEL BY RT-PCR (FLU A&B, COVID) [id number]  COMPREHENSIVE METABOLIC PANEL  HCG, QUANTITATIVE, PREGNANCY    EKG EKG Interpretation  Date/Time:  Wednesday December 11 2020 05:52:18 EDT Ventricular Rate:  59 PR Interval:  132 QRS Duration: 102 QT Interval:  400 QTC Calculation: 397 R Axis:   -3 Text Interpretation: Sinus rhythm S1,S2,S3 pattern Low voltage, precordial leads ST elev, probable normal early repol pattern Confirmed by Ross Marcus ([id number]) on 12/11/2020 6:44:00 AM  Radiology No results found.  Procedures Procedures   Medications Ordered in ED Medications  acetaminophen (TYLENOL) tablet 650 mg (has no administration in time range)  dicyclomine (BENTYL) capsule 10 mg (10 mg Oral Given 12/11/20 0545)  ondansetron (ZOFRAN) injection 4 mg (4 mg Intravenous Given 12/11/20 0545)  sodium chloride 0.9 % bolus 1,000 mL (1,000 mLs Intravenous New Bag/Given 12/11/20 0545)    ED Course  I have reviewed the triage vital signs and the nursing notes.  Pertinent labs & imaging results that were available during my care of the  patient were reviewed by me and considered in my medical decision making (see chart for details).    MDM Rules/Calculators/A&P                           Patient presents with nausea, vomiting, diarrhea and abdominal cramping.  History of the same.  He is nontoxic-appearing vital signs are reassuring.  Is afebrile.  Denies any sick contacts.  Abdomen is fairly benign.  Labs obtained.  Patient given Bentyl, Zofran, and fluids.  Labs without any evidence of leukocytosis.  No LFT derangements or metabolic derangements.  Low suspicion for appendicitis or obstructive pathology.  Patient did have ongoing cramping in the emergency department.  He was subsequently given Tylenol.  He is transgender female; however does still have a uterus.  Beta-hCG is pending and COVID-19 testing is pending.  Patient signed out to oncoming provider.  Final Clinical Impression(s) / ED Diagnoses Final diagnoses:  Nausea vomiting and diarrhea    Rx / DC Orders ED Discharge Orders     None        Shon Baton, MD 12/11/20 [phone]

## 2021-01-10 ENCOUNTER — Telehealth: Payer: Self-pay | Admitting: Physician Assistant

## 2021-01-10 NOTE — Telephone Encounter (Signed)
Spoke with patient, his appt has been rescheduled to see Dr. Marina Goodell on Tuesday, 01/21/21 at 3:40 pm.

## 2021-01-10 NOTE — Telephone Encounter (Signed)
Appt scheduled for 02/11/21 per Maurice March with PCP for patient but is requesting for nurse to call patient to see if can be scheduled sooner.  Please advise.

## 2021-01-21 ENCOUNTER — Ambulatory Visit (INDEPENDENT_AMBULATORY_CARE_PROVIDER_SITE_OTHER): Payer: BC Managed Care – PPO | Admitting: Internal Medicine

## 2021-01-21 ENCOUNTER — Encounter: Payer: Self-pay | Admitting: Internal Medicine

## 2021-01-21 VITALS — BP 140/72 | HR 59 | Ht 69.0 in | Wt 212.0 lb

## 2021-01-21 DIAGNOSIS — R109 Unspecified abdominal pain: Secondary | ICD-10-CM | POA: Diagnosis not present

## 2021-01-21 DIAGNOSIS — R112 Nausea with vomiting, unspecified: Secondary | ICD-10-CM | POA: Diagnosis not present

## 2021-01-21 DIAGNOSIS — R197 Diarrhea, unspecified: Secondary | ICD-10-CM

## 2021-01-21 MED ORDER — COLESTIPOL HCL 1 G PO TABS: 2.0000 g | ORAL_TABLET | Freq: Two times a day (BID) | ORAL | 3 refills | Status: DC

## 2021-01-21 NOTE — Patient Instructions (Signed)
If you are age 22 or older, your body mass index should be between 23-30. Your Body mass index is 31.31 kg/m. If this is out of the aforementioned range listed, please consider follow up with your Primary Care Provider.  If you are age 75 or younger, your body mass index should be between 19-25. Your Body mass index is 31.31 kg/m. If this is out of the aformentioned range listed, please consider follow up with your Primary Care Provider.   We have sent the following medications to your pharmacy for you to pick up at your convenience:  Colestid   _________________________________________________________  The [facility] providers would like to encourage you to use [facility] to communicate with providers for non-urgent requests or questions.  Due to long hold times on the telephone, sending your provider a message by [facility] may be a faster and more efficient way to get a response.  Please allow 48 business hours for a response.  Please remember that this is for non-urgent requests.   You have been scheduled for a gastric emptying scan at [facility] on 02/06/2021 at 7:30am. Please arrive 30 minutes prior to your appointment for registration. Please make certain not to have anything to eat or drink after midnight the night before your test. Hold all stomach medications (ex: Zofran, phenergan, Reglan) 48 hours prior to your test. If you need to reschedule your appointment, please contact radiology scheduling at [phone]. _________________________________________________________ A gastric-emptying study measures how long it takes for food to move through your stomach. There are several ways to measure stomach emptying. In the most common test, you eat food that contains a small amount of radioactive material. A scanner that detects the movement of the radioactive material is placed over your abdomen to monitor the rate at which food leaves your stomach. This test normally takes about 4 hours to  complete. ________________________________________________________  Stop smoking marijuana.

## 2021-01-21 NOTE — Progress Notes (Signed)
HISTORY OF PRESENT ILLNESS:  [name] is a 20 y.o. adult transgender (female to female) who presents today with chief complaint of chronic abdominal pain, chronic nausea with vomiting, and chronic diarrhea.  Patient's past medical history is remarkable for anxiety and depression which is managed by psychiatry, ADHD, and prior cholecystectomy.  Current history dates back to May 2021 with the patient developed C. difficile infection.  This was successfully treated.  Subsequently hospitalized with nausea, vomiting, abdominal pain, and diarrhea.  Extensive work-up including stool studies, laboratories, colonoscopy with biopsies, and upper endoscopy with biopsies were unrevealing.  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  Was seen in follow-up.  Reported doing better on dicyclomine.  Now states that symptoms persist.  The patient reports 3-4 bowel movements per day which are generally not formed.  No nocturnal component.  No bleeding.  Abdominal discomfort is typically lower.  There is an element of urgency.  Rare incontinence.  No bleeding.  No fevers.  Fluctuating weight.  Nausea is fairly constant.  Reports vomiting about once per day.  PCP was concerned about gastroparesis.  It sounds like metoclopramide was prescribed.  Has been on Nexium variably.  Also prescribed Vicodin which is taken at least once daily.  Denies active reflux symptoms.  No dysphagia.  Describes globus sensation.  Reports developing shortness of breath while eating.  Smokes cannabis daily.  Reports that the GI symptoms have resulted in worsening depression.  Also reports being unable to attend school or work.  Accompanied today by girlfriend.  Patient was evaluated in the emergency room December 11, 2020 regarding these same complaints.  Review of blood work from that visit revealed normal comprehensive metabolic panel.  Lipase 52.  Unremarkable CBC with hemoglobin 16.4.  Negative pregnancy test.  Contrast-enhanced CT scan of the abdomen and pelvis was  normal.  REVIEW OF SYSTEMS:  All non-GI ROS negative unless otherwise stated in the HPI except for anxiety depression, left hip pain, dysuria  Past Medical History:  Diagnosis Date   ADHD    Anxiety    Asthma    B12 deficiency    C. 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    Gallbladder sludge    Post concussion syndrome    Severe depression (HCC)     Past Surgical History:  Procedure Laterality Date   BIOPSY  12/04/2019   Procedure: BIOPSY;  Surgeon: Shellia Cleverly, DO;  Location: [facility];  Service: Gastroenterology;;   brain cyst removal     CHOLECYSTECTOMY     COLONOSCOPY WITH PROPOFOL N/A 12/04/2019   Procedure: COLONOSCOPY WITH PROPOFOL;  Surgeon: Shellia Cleverly, DO;  Location: [facility];  Service: Gastroenterology;  Laterality: N/A;   ESOPHAGOGASTRODUODENOSCOPY (EGD) WITH PROPOFOL N/A 12/04/2019   Procedure: ESOPHAGOGASTRODUODENOSCOPY (EGD) WITH PROPOFOL;  Surgeon: Shellia Cleverly, DO;  Location: [facility];  Service: Gastroenterology;  Laterality: N/A;   LIGAMENT REPAIR Left    radial   thumb surgery     ULNAR NERVE REPAIR Left 09/11/2019   WISDOM TOOTH EXTRACTION      Social History Jahnya Trindade  reports that he has never smoked. He has never used smokeless tobacco. He reports current alcohol use. He reports current drug use. Drug: Marijuana.  family history includes Deep vein thrombosis in his maternal grandfather; Diabetes in his maternal grandmother; Heart disease in his maternal grandfather, maternal grandmother, and paternal aunt; Hyperlipidemia in his maternal grandfather; Seizures in his maternal aunt; Stomach cancer in his paternal grandfather.  Allergies  Allergen Reactions   Bee Pollen Anaphylaxis   Mold Extract [Trichophyton] Anaphylaxis and Itching   Pollen Extract Anaphylaxis   Zyrtec [Cetirizine] Anaphylaxis   Mixed Ragweed Itching       PHYSICAL EXAMINATION: Vital signs: BP 140/72   Pulse (!) 59   Ht 5\' 9"   (1.753 m)   Wt 212 lb (96.2 kg)   BMI 31.31 kg/m   Constitutional: generally well-appearing, no acute distress Psychiatric: alert and oriented x3, cooperative Eyes: extraocular movements intact, anicteric, conjunctiva pink Mouth: oral pharynx moist, no lesions Neck: supple no lymphadenopathy Cardiovascular: heart regular rate and rhythm, no murmur Lungs: clear to auscultation bilaterally Abdomen: soft, nontender, nondistended, no obvious ascites, no peritoneal signs, normal bowel sounds, no organomegaly Rectal: Omitted Extremities: no clubbing, cyanosis, or lower extremity edema bilaterally Skin: no lesions on visible extremities Neuro: No focal deficits.  Cranial nerves intact.  Normal DTRs.  ASSESSMENT:  1.  Chronic nausea with some vomiting.  Suspect functional.  May have gastroparesis.  This would be exacerbated by narcotics.  Symptoms may be related to chronic cannabis use.  Previous upper endoscopy with biopsies normal. 2.  Chronic diarrhea.  Suspect functional.  IBS type.  May be bile salt related.  Negative work-up including colonoscopy with biopsies. 3.  Behavioral health problems (anxiety and depression).  Ongoing   PLAN:  1.  Solid-phase gastric emptying scan to rule out gastroparesis.  Told not to take narcotics within several days of this procedure.  We will contact the patient with the results when available. 2.  Prescribe Colestid 2 g twice daily for possible bile salt related diarrhea.  Do not take within 2 minutes of other medications. 3.  Stop chronic cannabis use 4.  Return to the care of your psychiatrist for better control of anxiety and depression issues.  Anxiety and depression issues can significantly affect GI symptomology. 5.  Ongoing general medical care with PCP. A total time of 40 minutes was spent preparing to see the patient, reviewing laboratories and x-rays, obtaining comprehensive history, performing comprehensive physical exam, counseling the patient  regarding the above listed issues, ordering advanced radiology studies and medications.  Finally documenting clinical information in the health record

## 2021-02-06 ENCOUNTER — Other Ambulatory Visit: Payer: Self-pay

## 2021-02-06 ENCOUNTER — Encounter (HOSPITAL_COMMUNITY)
Admission: RE | Admit: 2021-02-06 | Discharge: 2021-02-06 | Disposition: A | Discharge status: Home / Self Care | Payer: BC Managed Care – PPO | Source: Ambulatory Visit | Attending: Internal Medicine | Admitting: Internal Medicine

## 2021-02-06 DIAGNOSIS — R109 Unspecified abdominal pain: Secondary | ICD-10-CM | POA: Diagnosis present

## 2021-02-06 DIAGNOSIS — R112 Nausea with vomiting, unspecified: Secondary | ICD-10-CM | POA: Diagnosis not present

## 2021-02-06 DIAGNOSIS — R197 Diarrhea, unspecified: Secondary | ICD-10-CM | POA: Diagnosis present

## 2021-02-06 MED ORDER — TECHNETIUM TC 99M SULFUR COLLOID
2.0000 | Freq: Once | INTRAVENOUS | Status: AC
  Administered 2021-02-06: 2 via ORAL

## 2021-02-11 ENCOUNTER — Ambulatory Visit: Payer: BC Managed Care – PPO | Admitting: Physician Assistant

## 2021-05-16 DIAGNOSIS — R197 Diarrhea, unspecified: Secondary | ICD-10-CM | POA: Diagnosis not present

## 2021-05-16 DIAGNOSIS — R634 Abnormal weight loss: Secondary | ICD-10-CM | POA: Diagnosis not present

## 2021-05-16 DIAGNOSIS — R110 Nausea: Secondary | ICD-10-CM | POA: Diagnosis not present

## 2021-05-16 DIAGNOSIS — R1031 Right lower quadrant pain: Secondary | ICD-10-CM | POA: Diagnosis not present

## 2021-05-26 ENCOUNTER — Telehealth: Payer: Self-pay | Admitting: Hematology and Oncology

## 2021-05-26 NOTE — Telephone Encounter (Signed)
Scheduled appt per 1/13 referral. Pt is aware of appt date and time. Pt is aware to arrive 15 mins prior to appt time.  °

## 2021-06-09 ENCOUNTER — Other Ambulatory Visit: Payer: Self-pay

## 2021-06-09 ENCOUNTER — Inpatient Hospital Stay: Payer: BC Managed Care – PPO | Attending: Hematology and Oncology | Admitting: Hematology and Oncology

## 2021-06-09 DIAGNOSIS — R519 Headache, unspecified: Secondary | ICD-10-CM | POA: Diagnosis not present

## 2021-06-09 DIAGNOSIS — D751 Secondary polycythemia: Secondary | ICD-10-CM

## 2021-06-10 ENCOUNTER — Encounter: Payer: Self-pay | Admitting: Hematology and Oncology

## 2021-06-10 DIAGNOSIS — R519 Headache, unspecified: Secondary | ICD-10-CM | POA: Insufficient documentation

## 2021-06-10 DIAGNOSIS — D751 Secondary polycythemia: Secondary | ICD-10-CM | POA: Insufficient documentation

## 2021-06-10 NOTE — Progress Notes (Signed)
[facility] CONSULT NOTE  Patient Care Team: Sherren MochaShaw, Eva N, MD as PCP - General (Family Medicine)   ASSESSMENT & PLAN Secondary erythrocytosis The most likely cause of his erythrocytosis is due to testosterone use Since I am not managing his testosterone replacement therapy, I recommend discussion with the prescribing physician to consider lowering the dose If this is unacceptable, I suggest the patient to go and donate blood to get the hemoglobin down Alternatively, if he is not able to donate blood, he can call me and we will arrange for routine phlebotomy He does not need long-term follow-up for this  Intermittent headache His intermittent headaches is caused by multiple things I suspect he is chronically dehydrated due to reduced oral fluid intake His high hemoglobin can also cause headaches I recommend increase oral fluid intake, consideration to reduce the dose of testosterone replacement therapy and to donate blood  [name] 06/10/2021 10:31 AM  The total time spent in the appointment was 55 minutes encounter with patients including review of chart and various tests results, discussions about plan of care and coordination of care plan   All questions were answered. The patient knows to call the clinic with any problems, questions or concerns. No barriers to learning was detected.  Cynthia DelayNi Keeghan Mcintire, MD 1/31/202310:31 AM   CHIEF COMPLAINTS/PURPOSE OF CONSULTATION:  Erythrocytosis  HISTORY OF PRESENTING ILLNESS:  [name] 20 y.o. adult is here because of elevated hemoglobin.  He was found to have abnormal CBC from routine blood draw The patient underwent gender reassignment and started on testosterone replacement therapy 3 years ago His baseline CBC was 12.8 Over the course of 2021, it was noted that his hemoglobin started to trend up, ranging between 14.7-17 He has been complaining of intermittent headaches He denies shortness of breath on exertion, frequent  leg cramps and occasional chest pain.  He has well-controlled asthma He never suffer from diagnosis of blood clot.  There is no prior diagnosis of obstructive sleep apnea. The patient denies weight loss or skin itching. 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    Gallbladder sludge    Post concussion syndrome    Severe depression (HCC)     SURGICAL HISTORY: Past Surgical History:  Procedure Laterality Date   BIOPSY  12/04/2019   Procedure: BIOPSY;  Surgeon: Shellia Cleverlyirigliano, Vito V, DO;  Location: [facility];  Service: Gastroenterology;;   brain cyst removal     CHOLECYSTECTOMY     COLONOSCOPY WITH PROPOFOL N/A 12/04/2019   Procedure: COLONOSCOPY WITH PROPOFOL;  Surgeon: Shellia Cleverlyirigliano, Vito V, DO;  Location: [facility];  Service: Gastroenterology;  Laterality: N/A;   ESOPHAGOGASTRODUODENOSCOPY (EGD) WITH PROPOFOL N/A 12/04/2019   Procedure: ESOPHAGOGASTRODUODENOSCOPY (EGD) WITH PROPOFOL;  Surgeon: Shellia Cleverlyirigliano, Vito V, DO;  Location: [facility];  Service: Gastroenterology;  Laterality: N/A;   LIGAMENT REPAIR Left    radial   thumb surgery     ULNAR NERVE REPAIR Left 09/11/2019   WISDOM TOOTH EXTRACTION      SOCIAL HISTORY: Social History   Socioeconomic History   Marital status: Single    Spouse name: Not on file  Number of children: Not on file   Years of education: Not on file   Highest education level: Not on file  Occupational History   Not on file  Tobacco Use   Smoking status: Never   Smokeless tobacco: Never  Vaping Use   Vaping Use: Every day   Substances: Nicotine, THC, Flavoring  Substance and Sexual Activity   Alcohol use: Yes    Comment:  occasionally   Drug use: Yes    Types: Marijuana   Sexual activity: Not Currently  Other Topics Concern   Not on file  Social History Narrative   Not on file   Social Determinants of Health   Financial Resource Strain: Not on file  Food Insecurity: Not on file  Transportation Needs: Not on file  Physical Activity: Not on file  Stress: Not on file  Social Connections: Not on file  Intimate Partner Violence: Not on file    FAMILY HISTORY: Family History  Problem Relation Age of Onset   Seizures Maternal Aunt    Heart disease Paternal Aunt    Diabetes Maternal Grandmother    Heart disease Maternal Grandmother    Heart disease Maternal Grandfather    Deep vein thrombosis Maternal Grandfather    Hyperlipidemia Maternal Grandfather    Stomach cancer Paternal Grandfather    Colon cancer Neg Hx    Esophageal cancer Neg Hx    Pancreatic cancer Neg Hx     ALLERGIES:  is allergic to bee pollen, mold extract [trichophyton], pollen extract, zyrtec [cetirizine], and mixed ragweed.  MEDICATIONS:  Current Outpatient Medications  Medication Sig Dispense Refill   alosetron (LOTRONEX) 0.5 MG tablet Take 0.5 mg by mouth 2 (two) times daily.     TESTOSTERONE CYPIONATE IM Inject 75 mg into the skin once a week.     ADDERALL XR 20 MG 24 hr capsule Take 20 mg by mouth every morning.     albuterol (PROVENTIL HFA;VENTOLIN HFA) 108 (90 Base) MCG/ACT inhaler Inhale 2 puffs into the lungs every 6 (six) hours as needed for wheezing or shortness of breath.     colestipol (COLESTID) 1 g tablet Take 2 tablets (2 g total) by mouth 2 (two) times daily. 120 tablet 3   cyanocobalamin 1000 MCG tablet Take 1 tablet by mouth daily.     HYDROcodone-acetaminophen (NORCO/VICODIN) 5-325 MG tablet Take 1 tablet by mouth every 6 (six) hours as needed.     ondansetron (ZOFRAN-ODT) 8 MG disintegrating tablet Take 1 tablet (8 mg total) by mouth every 8 (eight) hours as needed. 20 tablet 0   Rimegepant Sulfate  (NURTEC) 75 MG TBDP Take 1 tablet by mouth daily as needed (headache).      testosterone cypionate (DEPOTESTOSTERONE CYPIONATE) 200 MG/ML injection Inject 7.75 mLs into the muscle every 14 (fourteen) days.      No current facility-administered medications for this visit.    REVIEW OF SYSTEMS:   Constitutional: Denies fevers, chills or abnormal night sweats Eyes: Denies blurriness of vision, double vision or watery eyes Ears, nose, mouth, throat, and face: Denies mucositis or sore throat Respiratory: Denies cough, dyspnea or wheezes Cardiovascular: Denies palpitation, chest discomfort or lower extremity swelling Skin: Denies abnormal skin rashes Lymphatics: Denies new lymphadenopathy or easy bruising Neurological:Denies numbness, tingling or new weaknesses Behavioral/Psych: Mood is stable, no new changes  All other systems were reviewed with the patient and are negative.  PHYSICAL EXAMINATION: ECOG PERFORMANCE STATUS: 1 - Symptomatic but completely ambulatory  Vitals:   06/09/21 1415  BP: 138/82  Pulse: 71  Resp: 18  Temp: 97.9 F (36.6 C)  SpO2: 98%   Filed Weights   06/09/21 1415  Weight: 241 lb (109.3 kg)    GENERAL:alert, no distress and comfortable.  Hirsutism is noted SKIN: skin color, texture, turgor are normal, no rashes or significant lesions.  Noted acne EYES: normal, conjunctiva are pink and non-injected, sclera clear OROPHARYNX:no exudate, no erythema and lips, buccal mucosa, and tongue normal  NECK: supple, thyroid normal size, non-tender, without nodularity LYMPH:  no palpable lymphadenopathy in the cervical, axillary or inguinal LUNGS: clear to auscultation and percussion with normal breathing effort HEART: regular rate & rhythm and no murmurs and no lower extremity edema ABDOMEN:abdomen soft, non-tender and normal bowel sounds Musculoskeletal:no cyanosis of digits and no clubbing  PSYCH: alert & oriented x 3 with fluent speech NEURO: no focal motor/sensory  deficits  LABORATORY DATA:  I have reviewed the data as listed

## 2021-06-10 NOTE — Assessment & Plan Note (Addendum)
His intermittent headaches is caused by multiple things I suspect he is chronically dehydrated due to reduced oral fluid intake His high hemoglobin can also cause headaches I recommend increase oral fluid intake, consideration to reduce the dose of testosterone replacement therapy and to donate blood

## 2021-06-10 NOTE — Assessment & Plan Note (Signed)
The most likely cause of his erythrocytosis is due to testosterone use Since I am not managing his testosterone replacement therapy, I recommend discussion with the prescribing physician to consider lowering the dose If this is unacceptable, I suggest the patient to go and donate blood to get the hemoglobin down Alternatively, if he is not able to donate blood, he can call me and we will arrange for routine phlebotomy He does not need long-term follow-up for this

## 2021-09-09 DIAGNOSIS — K580 Irritable bowel syndrome with diarrhea: Secondary | ICD-10-CM | POA: Diagnosis not present

## 2021-09-09 DIAGNOSIS — F908 Attention-deficit hyperactivity disorder, other type: Secondary | ICD-10-CM | POA: Diagnosis not present

## 2021-09-09 DIAGNOSIS — Z789 Other specified health status: Secondary | ICD-10-CM | POA: Diagnosis not present

## 2021-09-09 DIAGNOSIS — F418 Other specified anxiety disorders: Secondary | ICD-10-CM | POA: Diagnosis not present

## 2021-10-08 DIAGNOSIS — S93401A Sprain of unspecified ligament of right ankle, initial encounter: Secondary | ICD-10-CM | POA: Diagnosis not present

## 2021-10-16 ENCOUNTER — Emergency Department (HOSPITAL_COMMUNITY)
Admission: EM | Admit: 2021-10-16 | Discharge: 2021-10-17 | Disposition: A | Discharge status: Home / Self Care | Payer: BC Managed Care – PPO | Attending: Emergency Medicine | Admitting: Emergency Medicine

## 2021-10-16 ENCOUNTER — Encounter (HOSPITAL_COMMUNITY): Payer: Self-pay

## 2021-10-16 ENCOUNTER — Other Ambulatory Visit: Payer: Self-pay

## 2021-10-16 ENCOUNTER — Emergency Department (HOSPITAL_COMMUNITY): Payer: BC Managed Care – PPO

## 2021-10-16 DIAGNOSIS — Z20822 Contact with and (suspected) exposure to covid-19: Secondary | ICD-10-CM | POA: Insufficient documentation

## 2021-10-16 DIAGNOSIS — X501XXA Overexertion from prolonged static or awkward postures, initial encounter: Secondary | ICD-10-CM | POA: Insufficient documentation

## 2021-10-16 DIAGNOSIS — K2900 Acute gastritis without bleeding: Secondary | ICD-10-CM | POA: Diagnosis not present

## 2021-10-16 DIAGNOSIS — B349 Viral infection, unspecified: Secondary | ICD-10-CM | POA: Diagnosis not present

## 2021-10-16 DIAGNOSIS — S99911A Unspecified injury of right ankle, initial encounter: Secondary | ICD-10-CM | POA: Diagnosis not present

## 2021-10-16 DIAGNOSIS — R9431 Abnormal electrocardiogram [ECG] [EKG]: Secondary | ICD-10-CM | POA: Diagnosis not present

## 2021-10-16 DIAGNOSIS — S93401A Sprain of unspecified ligament of right ankle, initial encounter: Secondary | ICD-10-CM | POA: Insufficient documentation

## 2021-10-16 DIAGNOSIS — J45909 Unspecified asthma, uncomplicated: Secondary | ICD-10-CM | POA: Insufficient documentation

## 2021-10-16 DIAGNOSIS — R0602 Shortness of breath: Secondary | ICD-10-CM | POA: Diagnosis not present

## 2021-10-16 DIAGNOSIS — R197 Diarrhea, unspecified: Secondary | ICD-10-CM | POA: Diagnosis not present

## 2021-10-16 LAB — CBC WITH DIFFERENTIAL/PLATELET
Abs Immature Granulocytes: 0.02 10*3/uL (ref 0.00–0.07)
Basophils Absolute: 0 10*3/uL (ref 0.0–0.1)
Basophils Relative: 0 %
Eosinophils Absolute: 0.2 10*3/uL (ref 0.0–0.5)
Eosinophils Relative: 2 %
HCT: 45.7 % (ref 36.0–46.0)
Hemoglobin: 15.6 g/dL — ABNORMAL HIGH (ref 12.0–15.0)
Immature Granulocytes: 0 %
Lymphocytes Relative: 20 %
Lymphs Abs: 1.8 10*3/uL (ref 0.7–4.0)
MCH: 29.1 pg (ref 26.0–34.0)
MCHC: 34.1 g/dL (ref 30.0–36.0)
MCV: 85.1 fL (ref 80.0–100.0)
Monocytes Absolute: 0.8 10*3/uL (ref 0.1–1.0)
Monocytes Relative: 8 %
Neutro Abs: 6.2 10*3/uL (ref 1.7–7.7)
Neutrophils Relative %: 70 %
Platelets: 205 10*3/uL (ref 150–400)
RBC: 5.37 MIL/uL — ABNORMAL HIGH (ref 3.87–5.11)
RDW: 12.7 % (ref 11.5–15.5)
WBC: 9 10*3/uL (ref 4.0–10.5)
nRBC: 0 % (ref 0.0–0.2)

## 2021-10-16 LAB — COMPREHENSIVE METABOLIC PANEL
ALT: 18 U/L (ref 0–44)
AST: 16 U/L (ref 15–41)
Albumin: 4.5 g/dL (ref 3.5–5.0)
Alkaline Phosphatase: 49 U/L (ref 38–126)
Anion gap: 7 (ref 5–15)
BUN: 8 mg/dL (ref 6–20)
CO2: 23 mmol/L (ref 22–32)
Calcium: 8.8 mg/dL — ABNORMAL LOW (ref 8.9–10.3)
Chloride: 110 mmol/L (ref 98–111)
Creatinine, Ser: 0.74 mg/dL (ref 0.44–1.00)
GFR, Estimated: >60 mL/min (ref >60)
Glucose, Bld: 100 mg/dL — ABNORMAL HIGH (ref 70–99)
Potassium: 3.5 mmol/L (ref 3.5–5.1)
Sodium: 140 mmol/L (ref 135–145)
Total Bilirubin: 1 mg/dL (ref 0.3–1.2)
Total Protein: 8.1 g/dL (ref 6.5–8.1)

## 2021-10-16 LAB — LIPASE, BLOOD: Lipase: 25 U/L (ref 11–51)

## 2021-10-16 LAB — SARS CORONAVIRUS 2 BY RT PCR: SARS Coronavirus 2 by RT PCR: NEGATIVE

## 2021-10-16 LAB — HCG, QUANTITATIVE, PREGNANCY: hCG, Beta Chain, Quant, S: <1 m[IU]/mL (ref <5)

## 2021-10-16 MED ORDER — SODIUM CHLORIDE 0.9 % IV BOLUS
1000.0000 mL | Freq: Once | INTRAVENOUS | Status: AC
Start: 2021-10-16 — End: 2021-10-16
  Administered 2021-10-16: 1000 mL via INTRAVENOUS

## 2021-10-16 MED ORDER — ONDANSETRON HCL 4 MG/2ML IJ SOLN
4.0000 mg | Freq: Once | INTRAMUSCULAR | Status: AC
Start: 2021-10-16 — End: 2021-10-16
  Administered 2021-10-16: 4 mg via INTRAVENOUS
  Filled 2021-10-16: qty 2

## 2021-10-16 MED ORDER — ALUM & MAG HYDROXIDE-SIMETH 200-200-20 MG/5ML PO SUSP
30.0000 mL | Freq: Once | ORAL | Status: AC
  Administered 2021-10-16: 30 mL via ORAL
  Filled 2021-10-16: qty 30

## 2021-10-16 MED ORDER — SODIUM CHLORIDE 0.9 % IV SOLN
12.5000 mg | Freq: Once | INTRAVENOUS | Status: DC
  Filled 2021-10-16: qty 0.5

## 2021-10-16 MED ORDER — LIDOCAINE VISCOUS HCL 2 % MT SOLN
15.0000 mL | Freq: Once | OROMUCOSAL | Status: AC
  Administered 2021-10-16: 15 mL via ORAL
  Filled 2021-10-16: qty 15

## 2021-10-16 MED ORDER — KETOROLAC TROMETHAMINE 15 MG/ML IJ SOLN
15.0000 mg | Freq: Once | INTRAMUSCULAR | Status: AC
  Administered 2021-10-16: 15 mg via INTRAVENOUS
  Filled 2021-10-16: qty 1

## 2021-10-16 MED ORDER — ALBUTEROL SULFATE HFA 108 (90 BASE) MCG/ACT IN AERS
2.0000 | INHALATION_SPRAY | RESPIRATORY_TRACT | Status: DC | PRN
  Filled 2021-10-16: qty 6.7

## 2021-10-16 MED ORDER — ALBUTEROL SULFATE HFA 108 (90 BASE) MCG/ACT IN AERS
2.0000 | INHALATION_SPRAY | Freq: Once | RESPIRATORY_TRACT | Status: AC
  Administered 2021-10-16: 2 via RESPIRATORY_TRACT
  Filled 2021-10-16: qty 6.7

## 2021-10-16 MED ORDER — PANTOPRAZOLE SODIUM 40 MG IV SOLR
40.0000 mg | Freq: Once | INTRAVENOUS | Status: AC
  Administered 2021-10-16: 40 mg via INTRAVENOUS
  Filled 2021-10-16: qty 10

## 2021-10-16 NOTE — ED Provider Triage Note (Signed)
Emergency Medicine Provider Triage Evaluation Note  [name] , a 21 y.o. adult  was evaluated in triage.  Pt complains of mult complaint.  Reported having pain in the chest, productive cough, shortness of breath, upper abdominal pain, and body aches ongoing for the past 2 to 3 days.  Furthermore, patient twisted his right ankle a week ago and having pain with weightbearing.  Review of Systems  Positive: As above Negative: As above  Physical Exam  BP (!) 141/105 (BP Location: Left Arm)   Pulse 100   Temp 98.4 F (36.9 C) (Oral)   Resp 17   Ht 5\' 9"  (1.753 m)   Wt 108.4 kg   SpO2 99%   BMI 35.29 kg/m  Gen:   Awake, no distress   Resp:  Normal effort  MSK:   Moves extremities without difficulty  Other:    Medical Decision Making  Medically screening exam initiated at 1:40 PM.  Appropriate orders placed.  [name] was informed that the remainder of the evaluation will be completed by another provider, this initial triage assessment does not replace that evaluation, and the importance of remaining in the ED until their evaluation is complete.     Cynthia Moras, PA-C 10/16/21 1340

## 2021-10-16 NOTE — ED Triage Notes (Addendum)
Patient c/o SOB, abdominal pain x 3 days. Patient c/o emesis and diarrhea that started today.  Patient reports that he tripped on a piece of rebar and twisted the right ankle 1 week ago.

## 2021-10-16 NOTE — ED Provider Notes (Signed)
[facility]-EMERGENCY DEPT Provider Note   CSN: [id number] Arrival date & time: 10/16/21  1322     History  Chief Complaint  Patient presents with   Shortness of Breath   Abdominal Pain   Emesis   Ankle Pain    [name] is a 21 y.o. adult.  Patient is a 21 year old female with a history of asthma, ADHD and prior C. difficile infection.  He states over the last 3 to 4 days he has had some runny nose and congestion and a little bit of mild coughing.  His friend at bedside had similar symptoms.  Today started having some vomiting and diarrhea.  His emesis is nonbloody and nonbilious.  His diarrhea is watery and nonbloody.  He does have a prior history of a C. difficile infection.  He does not have any fevers.  He is having some crampy abdominal pain, mostly in the top part of his abdomen.  No known urinary symptoms.  He has felt a little short of breath.  He tried to use an inhaler at home and feels like it has not been working.  He thinks his inhaler is bad.  He has some pain in the center of his chest.  He says it feels like a soreness.  No exertional symptoms.  He also has some pain in his right ankle which he said he twisted a few days ago.       Home Medications Prior to Admission medications   Medication Sig Start Date End Date Taking? Authorizing Provider  ADDERALL XR 20 MG 24 hr capsule Take 20 mg by mouth every morning. 08/29/20   [provider]  albuterol (PROVENTIL HFA;VENTOLIN HFA) 108 (90 Base) MCG/ACT inhaler Inhale 2 puffs into the lungs every 6 (six) hours as needed for wheezing or shortness of breath.    [provider]  alosetron (LOTRONEX) 0.5 MG tablet Take 0.5 mg by mouth 2 (two) times daily. 05/19/21   [provider]  colestipol (COLESTID) 1 g tablet Take 2 tablets (2 g total) by mouth 2 (two) times daily. 01/21/21   Hilarie FredricksonPerry, John N, MD  cyanocobalamin 1000 MCG tablet Take 1 tablet by mouth daily. 04/04/18   [provider]  HYDROcodone-acetaminophen (NORCO/VICODIN) 5-325 MG tablet Take 1 tablet by mouth every 6 (six) hours as needed. 12/12/20   [provider]  ondansetron (ZOFRAN-ODT) 8 MG disintegrating tablet Take 1 tablet (8 mg total) by mouth every 8 (eight) hours as needed. 12/11/20   Jacalyn LefevreHaviland, Julie, MD  Rimegepant Sulfate (NURTEC) 75 MG TBDP Take 1 tablet by mouth daily as needed (headache).     [provider]  testosterone cypionate (DEPOTESTOSTERONE CYPIONATE) 200 MG/ML injection Inject 7.75 mLs into the muscle every 14 (fourteen) days.  08/11/18   [provider]  TESTOSTERONE CYPIONATE IM Inject 75 mg into the skin once a week. 08/11/18   [provider]      Allergies    Bee pollen, Mold extract [trichophyton], Pollen extract, Zyrtec [cetirizine], and Mixed ragweed    Review of Systems   Review of Systems  Constitutional:  Negative for chills, diaphoresis, fatigue and fever.  HENT:  Positive for congestion and rhinorrhea. Negative for sneezing.   Eyes: Negative.   Respiratory:  Positive for cough and shortness of breath. Negative for chest tightness.   Cardiovascular:  Negative for chest pain and leg swelling.  Gastrointestinal:  Positive for abdominal pain, diarrhea, nausea and vomiting. Negative for blood in  stool.  Genitourinary:  Negative for difficulty urinating, flank pain, frequency and hematuria.  Musculoskeletal:  Negative for arthralgias and back pain.  Skin:  Negative for rash.  Neurological:  Negative for dizziness, speech difficulty, weakness, numbness and headaches.    Physical Exam Updated Vital Signs BP 130/85   Pulse 64   Temp 98.4 F (36.9 C) (Oral)   Resp (!) 23   Ht 5\' 9"  (1.753 m)   Wt 108.4 kg   SpO2 100%   BMI 35.29 kg/m  Physical Exam Constitutional:      Appearance: He is well-developed.  HENT:     Head: Normocephalic and atraumatic.     Mouth/Throat:     Pharynx: Oropharynx is clear. No oropharyngeal exudate or  posterior oropharyngeal erythema.  Eyes:     Pupils: Pupils are equal, round, and reactive to light.  Cardiovascular:     Rate and Rhythm: Normal rate and regular rhythm.     Heart sounds: Normal heart sounds.  Pulmonary:     Effort: Pulmonary effort is normal. No respiratory distress.     Breath sounds: Normal breath sounds. No wheezing or rales.  Chest:     Chest wall: No tenderness.  Abdominal:     General: Bowel sounds are normal.     Palpations: Abdomen is soft.     Tenderness: There is no abdominal tenderness (Generalized). There is no guarding or rebound.  Musculoskeletal:        General: Normal range of motion.     Cervical back: Normal range of motion and neck supple.     Comments: Tenderness over the lateral malleolus of the right ankle, no swelling or deformity, neurovascular intact  Lymphadenopathy:     Cervical: No cervical adenopathy.  Skin:    General: Skin is warm and dry.     Findings: No rash.  Neurological:     Mental Status: He is alert and oriented to person, place, and time.     ED Results / Procedures / Treatments   Labs (all labs ordered are listed, but only abnormal results are displayed) Labs Reviewed  CBC WITH DIFFERENTIAL/PLATELET - Abnormal; Notable for the following components:      Result Value   RBC 5.37 (*)    Hemoglobin 15.6 (*)    All other components within normal limits  COMPREHENSIVE METABOLIC PANEL - Abnormal; Notable for the following components:   Glucose, Bld 100 (*)    Calcium 8.8 (*)    All other components within normal limits  SARS CORONAVIRUS 2 BY RT PCR  LIPASE, BLOOD  HCG, QUANTITATIVE, PREGNANCY    EKG EKG Interpretation  Date/Time:  Thursday October 16 2021 13:35:02 EDT Ventricular Rate:  97 PR Interval:  140 QRS Duration: 97 QT Interval:  338 QTC Calculation: 430 R Axis:   129 Text Interpretation: Sinus rhythm Right axis deviation Low voltage, precordial leads Baseline wander in lead(s) III since last tracing no  significant change Confirmed by 1984 [phone]) on 10/16/2021 9:31:56 PM  Radiology DG Chest 2 View  Result Date: 10/16/2021 CLINICAL DATA:  Shortness of breath for 3 days. Emesis and diarrhea starting today. EXAM: CHEST - 2 VIEW COMPARISON:  Chest two views 10/15/2020 FINDINGS: Cardiac silhouette and mediastinal contours are within normal limits. The lungs are clear. No pleural effusion or pneumothorax. No acute skeletal abnormality. Cholecystectomy clips. IMPRESSION: No active cardiopulmonary disease. Electronically Signed   By: 12/15/2020 M.D.   On: 10/16/2021 14:03   DG Ankle Complete  Right  Result Date: 10/16/2021 CLINICAL DATA:  Tripped on piece of rebar and twisted right ankle 1 week ago. EXAM: RIGHT ANKLE - COMPLETE 3+ VIEW COMPARISON:  Right foot radiographs 03/14/2017 FINDINGS: The ankle mortise is symmetric and intact. Normal bone mineralization. Joint spaces are preserved. No acute fracture or dislocation. IMPRESSION: Normal right ankle radiographs. Electronically Signed   By: Neita Garnet M.D.   On: 10/16/2021 14:02    Procedures Procedures    Medications Ordered in ED Medications  albuterol (VENTOLIN HFA) 108 (90 Base) MCG/ACT inhaler 2 puff (has no administration in time range)  promethazine (PHENERGAN) 12.5 mg in sodium chloride 0.9 % 50 mL IVPB (has no administration in time range)  sodium chloride 0.9 % bolus 1,000 mL (0 mLs Intravenous Stopped 10/16/21 2307)  ondansetron (ZOFRAN) injection 4 mg (4 mg Intravenous Given 10/16/21 2202)  pantoprazole (PROTONIX) injection 40 mg (40 mg Intravenous Given 10/16/21 2205)  ketorolac (TORADOL) 15 MG/ML injection 15 mg (15 mg Intravenous Given 10/16/21 2204)  albuterol (VENTOLIN HFA) 108 (90 Base) MCG/ACT inhaler 2 puff (2 puffs Inhalation Given 10/16/21 2203)  ondansetron (ZOFRAN) injection 4 mg (4 mg Intravenous Given 10/16/21 2322)  alum & mag hydroxide-simeth (MAALOX/MYLANTA) 200-200-20 MG/5ML suspension 30 mL (30 mLs Oral Given 10/16/21  2322)    And  lidocaine (XYLOCAINE) 2 % viscous mouth solution 15 mL (15 mLs Oral Given 10/16/21 2322)    ED Course/ Medical Decision Making/ A&P                           Medical Decision Making Amount and/or Complexity of Data Reviewed Radiology: ordered.  Risk OTC drugs. Prescription drug management.   Patient is a 21 year old female who presents with viral-like symptoms with some initial URI symptoms and now nausea vomiting and diarrhea.  He has some associated pain in his epigastrium and right upper quadrant.  He has not had any diarrhea in the last several hours.  We were able to get a stool sample due to this.  His labs are nonconcerning.  There is no suggestions of pancreatitis.  His LFTs are normal.  He is status postcholecystectomy.  He is afebrile.  Otherwise well-appearing.  He was given IV fluids and antiemetics.  He is feeling a bit better but still has some pain in his epigastrium.  Given that there is no evidence of pancreatitis, he has already had his gallbladder out and he does not have any other associated abdominal pain, I do not feel that CT imaging is indicated currently.  I feel he likely has a gastritis from the recent vomiting.  Chest x-ray was performed which inter by me and confirmed by the radiologist that show no evidence of pneumonia.  No other acute abnormality.  He did have an injury to his right ankle.  Clinically there is no deformity or swelling.  X-rays reveal no evidence of acute fracture.  These were interpreted by me.  Patient still with nausea and vomiting.  We will try some Phenergan and reassess.  Care turned over to Dr. Nicanor Alcon pending reassessment.  Final Clinical Impression(s) / ED Diagnoses Final diagnoses:  Viral syndrome  Acute gastritis without hemorrhage, unspecified gastritis type  Sprain of right ankle, unspecified ligament, initial encounter    Rx / DC Orders ED Discharge Orders     None         Rolan Bucco, MD 10/16/21  2351

## 2021-10-17 MED ORDER — HALOPERIDOL LACTATE 5 MG/ML IJ SOLN
2.0000 mg | Freq: Once | INTRAMUSCULAR | Status: AC
  Administered 2021-10-17: 2 mg via INTRAVENOUS
  Filled 2021-10-17: qty 1

## 2021-12-11 DIAGNOSIS — F418 Other specified anxiety disorders: Secondary | ICD-10-CM | POA: Diagnosis not present

## 2021-12-11 DIAGNOSIS — J4520 Mild intermittent asthma, uncomplicated: Secondary | ICD-10-CM | POA: Diagnosis not present

## 2021-12-11 DIAGNOSIS — F908 Attention-deficit hyperactivity disorder, other type: Secondary | ICD-10-CM | POA: Diagnosis not present

## 2021-12-11 DIAGNOSIS — Z789 Other specified health status: Secondary | ICD-10-CM | POA: Diagnosis not present

## 2022-01-02 DIAGNOSIS — S62501A Fracture of unspecified phalanx of right thumb, initial encounter for closed fracture: Secondary | ICD-10-CM | POA: Diagnosis not present

## 2022-01-02 DIAGNOSIS — M79644 Pain in right finger(s): Secondary | ICD-10-CM | POA: Diagnosis not present

## 2022-01-02 DIAGNOSIS — M25541 Pain in joints of right hand: Secondary | ICD-10-CM | POA: Diagnosis not present

## 2022-01-09 DIAGNOSIS — S63641D Sprain of metacarpophalangeal joint of right thumb, subsequent encounter: Secondary | ICD-10-CM | POA: Diagnosis not present

## 2022-02-02 IMAGING — CR DG LUMBAR SPINE COMPLETE 4+V
5 series · 5 of 5 positions shown · non-contrast
Comparison: CT abdomen/pelvis 10/25/2019

CLINICAL DATA: Tenderness of back. Severe vertebral tenderness for
2 weeks with intermittent radiculopathy. Additional history
provided: Patient reports low back pain for 3 months.

EXAM:
LUMBAR SPINE - COMPLETE 4+ VIEW

[t l-spine a.p.]
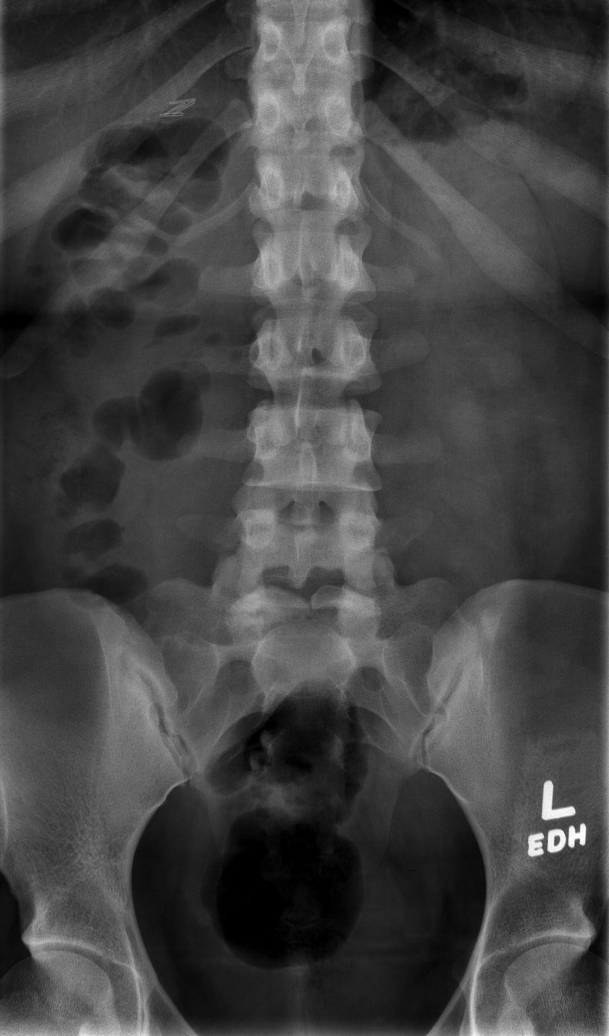

[t l-spine oblique exposure (1 of 2)]
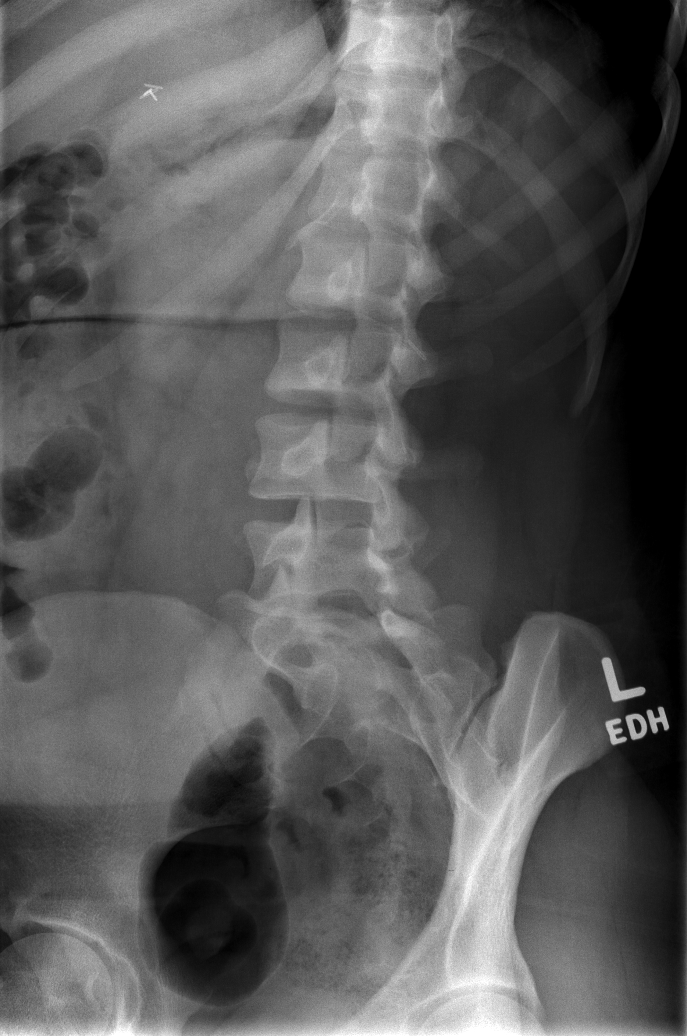

[t l-spine oblique exposure (2 of 2)]
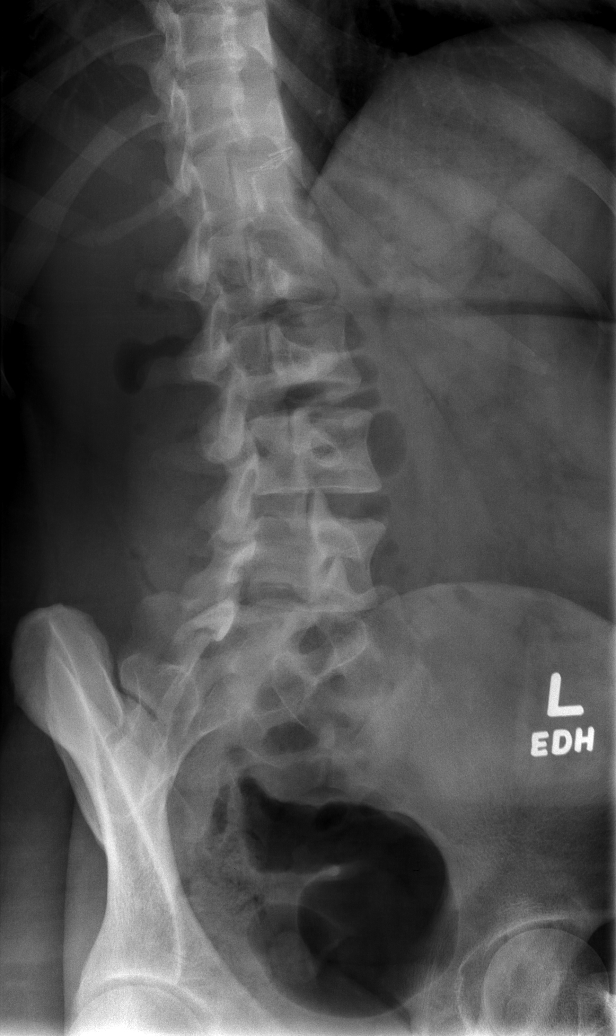

[t l-spine lat]
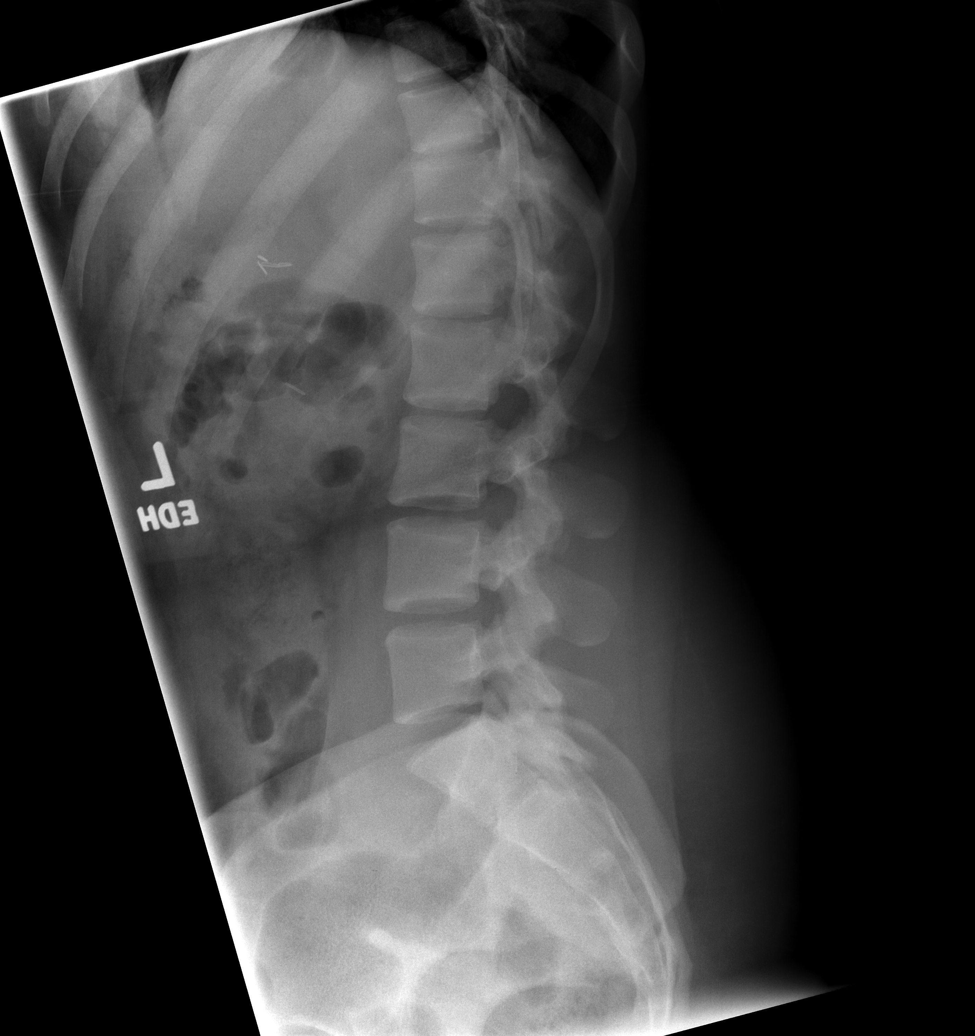

[t l-spine l5-s1 spot]
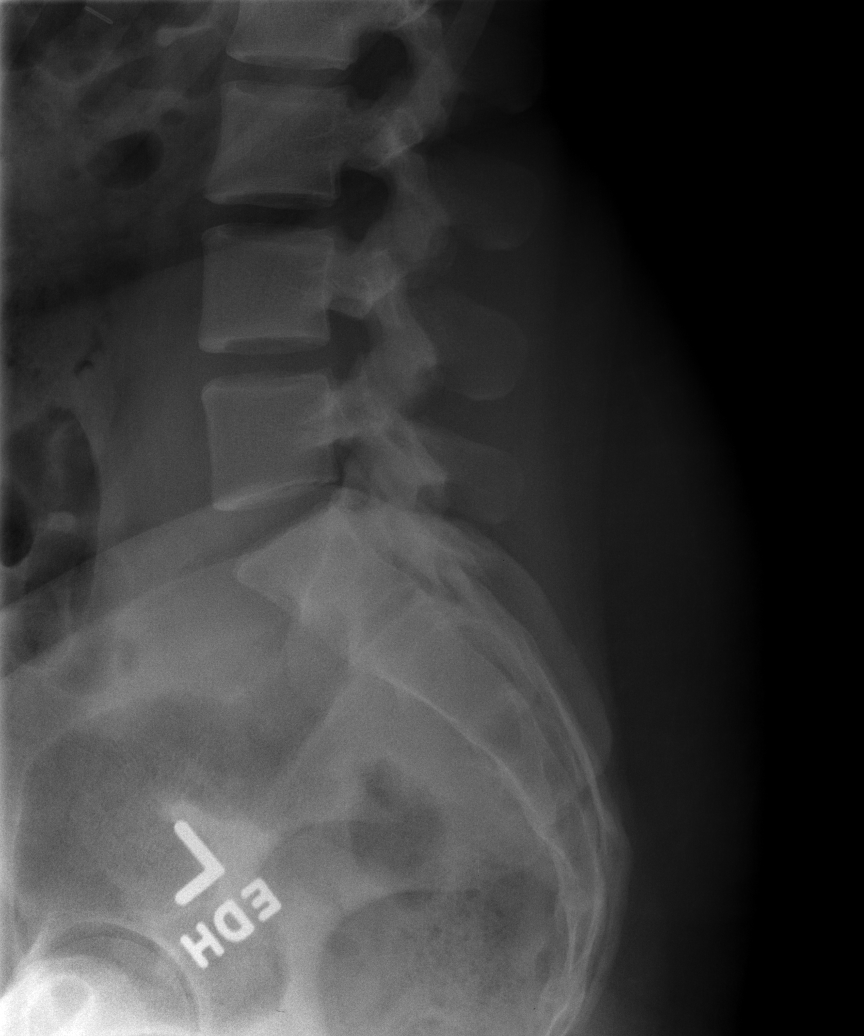

[5 of 5 positions shown; findings below may reference images not displayed]

FINDINGS: Transitional lumbosacral anatomy.

No significant spondylolisthesis.

Vertebral body height is maintained. No evidence of lumbar
compression fracture.

Intervertebral disc height is maintained.

Surgical clips within the right upper quadrant of the abdomen.
IMPRESSION: Transitional lumbosacral anatomy.

No evidence of lumbar compression fracture.

No significant appreciable lumbar spondylosis.

## 2022-02-22 ENCOUNTER — Encounter (HOSPITAL_BASED_OUTPATIENT_CLINIC_OR_DEPARTMENT_OTHER): Payer: Self-pay

## 2022-02-22 ENCOUNTER — Emergency Department (HOSPITAL_BASED_OUTPATIENT_CLINIC_OR_DEPARTMENT_OTHER): Payer: BC Managed Care – PPO

## 2022-02-22 ENCOUNTER — Other Ambulatory Visit: Payer: Self-pay

## 2022-02-22 ENCOUNTER — Emergency Department (HOSPITAL_BASED_OUTPATIENT_CLINIC_OR_DEPARTMENT_OTHER)
Admission: EM | Admit: 2022-02-22 | Discharge: 2022-02-22 | Disposition: A | Discharge status: Home / Self Care | Payer: BC Managed Care – PPO | Attending: Emergency Medicine | Admitting: Emergency Medicine

## 2022-02-22 DIAGNOSIS — Z79899 Other long term (current) drug therapy: Secondary | ICD-10-CM | POA: Insufficient documentation

## 2022-02-22 DIAGNOSIS — D696 Thrombocytopenia, unspecified: Secondary | ICD-10-CM | POA: Diagnosis not present

## 2022-02-22 DIAGNOSIS — Z1152 Encounter for screening for COVID-19: Secondary | ICD-10-CM | POA: Insufficient documentation

## 2022-02-22 DIAGNOSIS — R197 Diarrhea, unspecified: Secondary | ICD-10-CM | POA: Diagnosis not present

## 2022-02-22 DIAGNOSIS — R112 Nausea with vomiting, unspecified: Secondary | ICD-10-CM | POA: Diagnosis not present

## 2022-02-22 DIAGNOSIS — D72819 Decreased white blood cell count, unspecified: Secondary | ICD-10-CM | POA: Diagnosis not present

## 2022-02-22 DIAGNOSIS — R109 Unspecified abdominal pain: Secondary | ICD-10-CM | POA: Diagnosis not present

## 2022-02-22 DIAGNOSIS — J45909 Unspecified asthma, uncomplicated: Secondary | ICD-10-CM | POA: Insufficient documentation

## 2022-02-22 DIAGNOSIS — R1110 Vomiting, unspecified: Secondary | ICD-10-CM | POA: Diagnosis not present

## 2022-02-22 DIAGNOSIS — A084 Viral intestinal infection, unspecified: Secondary | ICD-10-CM | POA: Diagnosis not present

## 2022-02-22 DIAGNOSIS — R509 Fever, unspecified: Secondary | ICD-10-CM | POA: Diagnosis not present

## 2022-02-22 LAB — CBC WITH DIFFERENTIAL/PLATELET
Abs Immature Granulocytes: 0 10*3/uL (ref 0.00–0.07)
Basophils Absolute: 0 10*3/uL (ref 0.0–0.1)
Basophils Relative: 0 %
Eosinophils Absolute: 0 10*3/uL (ref 0.0–0.5)
Eosinophils Relative: 0 %
HCT: 41.9 % (ref 36.0–46.0)
Hemoglobin: 14.6 g/dL (ref 12.0–15.0)
Immature Granulocytes: 0 %
Lymphocytes Relative: 20 %
Lymphs Abs: 0.8 10*3/uL (ref 0.7–4.0)
MCH: 28.5 pg (ref 26.0–34.0)
MCHC: 34.8 g/dL (ref 30.0–36.0)
MCV: 81.8 fL (ref 80.0–100.0)
Monocytes Absolute: 0.4 10*3/uL (ref 0.1–1.0)
Monocytes Relative: 11 %
Neutro Abs: 2.6 10*3/uL (ref 1.7–7.7)
Neutrophils Relative %: 69 %
Platelets: 74 10*3/uL — ABNORMAL LOW (ref 150–400)
RBC: 5.12 MIL/uL — ABNORMAL HIGH (ref 3.87–5.11)
RDW: 12.4 % (ref 11.5–15.5)
WBC: 3.8 10*3/uL — ABNORMAL LOW (ref 4.0–10.5)
nRBC: 0 % (ref 0.0–0.2)

## 2022-02-22 LAB — HCG, SERUM, QUALITATIVE: Preg, Serum: NEGATIVE

## 2022-02-22 LAB — CBC
HCT: 49.2 % — ABNORMAL HIGH (ref 36.0–46.0)
Hemoglobin: 17.5 g/dL — ABNORMAL HIGH (ref 12.0–15.0)
MCH: 29.2 pg (ref 26.0–34.0)
MCHC: 35.6 g/dL (ref 30.0–36.0)
MCV: 82 fL (ref 80.0–100.0)
Platelets: 83 10*3/uL — ABNORMAL LOW (ref 150–400)
RBC: 6 MIL/uL — ABNORMAL HIGH (ref 3.87–5.11)
RDW: 12.4 % (ref 11.5–15.5)
WBC: 3.6 10*3/uL — ABNORMAL LOW (ref 4.0–10.5)
nRBC: 0 % (ref 0.0–0.2)

## 2022-02-22 LAB — URINALYSIS, ROUTINE W REFLEX MICROSCOPIC
Bilirubin Urine: NEGATIVE
Glucose, UA: NEGATIVE mg/dL
Hgb urine dipstick: NEGATIVE
Ketones, ur: 40 mg/dL — AB
Leukocytes,Ua: NEGATIVE
Nitrite: NEGATIVE
Protein, ur: 30 mg/dL — AB
Specific Gravity, Urine: >1.046 — ABNORMAL HIGH (ref 1.005–1.030)
pH: 6.5 (ref 5.0–8.0)

## 2022-02-22 LAB — COMPREHENSIVE METABOLIC PANEL
ALT: 27 U/L (ref 0–44)
AST: 30 U/L (ref 15–41)
Albumin: 4.8 g/dL (ref 3.5–5.0)
Alkaline Phosphatase: 53 U/L (ref 38–126)
Anion gap: 12 (ref 5–15)
BUN: 10 mg/dL (ref 6–20)
CO2: 23 mmol/L (ref 22–32)
Calcium: 9.5 mg/dL (ref 8.9–10.3)
Chloride: 102 mmol/L (ref 98–111)
Creatinine, Ser: 1.17 mg/dL — ABNORMAL HIGH (ref 0.44–1.00)
GFR, Estimated: >60 mL/min (ref >60)
Glucose, Bld: 99 mg/dL (ref 70–99)
Potassium: 3.5 mmol/L (ref 3.5–5.1)
Sodium: 137 mmol/L (ref 135–145)
Total Bilirubin: 0.7 mg/dL (ref 0.3–1.2)
Total Protein: 8.2 g/dL — ABNORMAL HIGH (ref 6.5–8.1)

## 2022-02-22 LAB — RESP PANEL BY RT-PCR (FLU A&B, COVID) ARPGX2
Influenza A by PCR: NEGATIVE
Influenza B by PCR: NEGATIVE
SARS Coronavirus 2 by RT PCR: NEGATIVE

## 2022-02-22 LAB — LIPASE, BLOOD: Lipase: 14 U/L (ref 11–51)

## 2022-02-22 LAB — FIBRINOGEN: Fibrinogen: 375 mg/dL (ref 210–475)

## 2022-02-22 LAB — PROTIME-INR
INR: 1.1 (ref 0.8–1.2)
Prothrombin Time: 14.5 seconds (ref 11.4–15.2)

## 2022-02-22 LAB — HIV ANTIBODY (ROUTINE TESTING W REFLEX): HIV Screen 4th Generation wRfx: NONREACTIVE

## 2022-02-22 MED ORDER — ACETAMINOPHEN 500 MG PO TABS
1000.0000 mg | ORAL_TABLET | Freq: Once | ORAL | Status: AC
  Administered 2022-02-22: 1000 mg via ORAL
  Filled 2022-02-22: qty 2

## 2022-02-22 MED ORDER — SODIUM CHLORIDE 0.9 % IV BOLUS
500.0000 mL | Freq: Once | INTRAVENOUS | Status: AC
Start: 2022-02-22 — End: 2022-02-22
  Administered 2022-02-22: 500 mL via INTRAVENOUS

## 2022-02-22 MED ORDER — IOHEXOL 300 MG/ML  SOLN
100.0000 mL | Freq: Once | INTRAMUSCULAR | Status: AC | PRN
  Administered 2022-02-22: 100 mL via INTRAVENOUS

## 2022-02-22 MED ORDER — ONDANSETRON HCL 4 MG/2ML IJ SOLN
4.0000 mg | Freq: Once | INTRAMUSCULAR | Status: AC
  Administered 2022-02-22: 4 mg via INTRAVENOUS
  Filled 2022-02-22: qty 2

## 2022-02-22 MED ORDER — DIPHENHYDRAMINE HCL 50 MG/ML IJ SOLN
25.0000 mg | Freq: Once | INTRAMUSCULAR | Status: AC
  Administered 2022-02-22: 25 mg via INTRAVENOUS
  Filled 2022-02-22: qty 1

## 2022-02-22 MED ORDER — ONDANSETRON HCL 4 MG PO TABS: 4.0000 mg | ORAL_TABLET | Freq: Four times a day (QID) | ORAL | 0 refills | Status: AC

## 2022-02-22 MED ORDER — SODIUM CHLORIDE 0.9 % IV BOLUS
1000.0000 mL | Freq: Once | INTRAVENOUS | Status: AC
  Administered 2022-02-22: 1000 mL via INTRAVENOUS

## 2022-02-22 MED ORDER — KETOROLAC TROMETHAMINE 30 MG/ML IJ SOLN: 15.0000 mg | Freq: Once | INTRAMUSCULAR | Status: DC

## 2022-02-22 MED ORDER — PROCHLORPERAZINE EDISYLATE 10 MG/2ML IJ SOLN
10.0000 mg | Freq: Once | INTRAMUSCULAR | Status: DC
Start: 2022-02-22 — End: 2022-02-23
  Filled 2022-02-22: qty 2

## 2022-02-22 NOTE — ED Triage Notes (Signed)
Patient here POV from Home.  Endorses N/V/D associated with headache for approximately 4-5 Days. No Confirmed Fevers at Home but endorses Consistent Tylenol Use when able.  Sent by UC for Evaluation and Possible Dehydration.   NAD Noted during Triage. A&OX4. GCS 15. BIB Wheelchair.

## 2022-02-22 NOTE — ED Provider Notes (Signed)
[facility] EMERGENCY DEPT Provider Note   CSN: [id number] Arrival date & time: 02/22/22  1208     History {Add pertinent medical, surgical, social history, OB history to HPI:1} Chief Complaint  Patient presents with   Emesis    [name] is a 21 y.o. adult.   Emesis   21 year old biological female presents emergency department with complaints of nausea, vomiting, diarrhea and headache for the past 4 to 5 days.  The patient has been taking at home Tylenol for headache.  Headache is described as gradual in onset and similar to other headaches experienced in the past.  Patient reports no fever at home but has been taking Tylenol so is unsure whether or not she has been febrile.  Patient is also complaining of right lower quadrant abdominal pain that is worsened with eating.  Patient was seen at urgent care earlier today and sent to the emergency department for further evaluation.  Patient denies fever, chills, night sweats, chest pain, shortness of breath, cough, neck stiffness, hematemesis, dysuria, hematuria, vaginal discharge/bleeding, hematochezia/melena.  Prior abdominal surgeries include cholecystectomy.  Denies history of IV drug use or promiscuous sexual activity, recent travel/hiking/camping or recent antibiotic use.  Denies visual changes, weakness/sensory deficits, gait abnormality, slurred speech, facial droop.  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 difficile, depression, intermittent headache.  Home Medications Prior to Admission medications   Medication Sig Start Date End Date Taking? Authorizing Provider  albuterol (PROVENTIL HFA;VENTOLIN HFA) 108 (90 Base) MCG/ACT inhaler Inhale 2 puffs into the lungs every 6 (six) hours as needed for wheezing or shortness of breath.    [provider]  cyanocobalamin 1000 MCG tablet Take 1 tablet by mouth daily. 04/04/18   [provider]   ondansetron (ZOFRAN-ODT) 8 MG disintegrating tablet Take 1 tablet (8 mg total) by mouth every 8 (eight) hours as needed. 12/11/20   Isla Pence, MD  TESTOSTERONE CYPIONATE IM Inject 75 mg into the skin once a week. Sunday 08/11/18   [provider]      Allergies    Bee pollen, Mold extract [trichophyton], Pollen extract, Zyrtec [cetirizine], and Mixed ragweed    Review of Systems   Review of Systems  Gastrointestinal:  Positive for vomiting.  All other systems reviewed and are negative.   Physical Exam Updated Vital Signs BP 136/86 (BP Location: Right Arm)   Pulse 94   Temp 99.2 F (37.3 C) (Oral)   Resp 17   Ht 5\' 9"  (1.753 m)   Wt 108.4 kg   SpO2 100%   BMI 35.29 kg/m  Physical Exam Vitals and nursing note reviewed.  Constitutional:      General: He is not in acute distress.    Appearance: Normal appearance. He is well-developed.  HENT:     Head: Normocephalic and atraumatic.     Nose: Congestion and rhinorrhea present.     Mouth/Throat:     Mouth: Mucous membranes are moist.     Pharynx: Oropharynx is clear.     Comments: No obvious petechiae purpura noted in oropharynx Eyes:     Extraocular Movements: Extraocular movements intact.     Conjunctiva/sclera: Conjunctivae normal.     Pupils: Pupils are equal, round, and reactive to light.  Neck:     Comments: Kernig and Brudzinski negative. Cardiovascular:     Rate and Rhythm: Normal rate and regular rhythm.     Pulses: Normal pulses.     Heart sounds: No  murmur heard. Pulmonary:     Effort: Pulmonary effort is normal. No respiratory distress.     Breath sounds: Normal breath sounds. No wheezing or rales.  Abdominal:     Palpations: Abdomen is soft.     Tenderness: There is abdominal tenderness.     Comments: Right lower quadrant tenderness to palpation.  Musculoskeletal:        General: No swelling.     Cervical back: Normal range of motion and neck supple. No rigidity or tenderness.     Right  lower leg: No edema.     Left lower leg: No edema.  Skin:    General: Skin is warm and dry.     Capillary Refill: Capillary refill takes less than 2 seconds.     Comments: No evidence of gingival bleeding or bleeding elsewhere.  No evidence of petechial or purpuric rash noted on full-body exam.  Neurological:     Mental Status: He is alert.     Comments: Alert and oriented to self, place, time and event.   Speech is fluent, clear without dysarthria or dysphasia.   Strength 5/5 in upper/lower extremities   Sensation intact in upper/lower extremities   Normal gait.  Negative Romberg. No pronator drift.  Normal finger-to-nose and feet tapping.  CN I not tested  CN II grossly intact visual fields bilaterally. Did not visualize posterior eye.  CN III, IV, VI PERRLA and EOMs intact bilaterally  CN V Intact sensation to sharp and light touch to the face  CN VII facial movements symmetric  CN VIII not tested  CN IX, X no uvula deviation, symmetric rise of soft palate  CN XI 5/5 SCM and trapezius strength bilaterally  CN XII Midline tongue protrusion, symmetric L/R movements     Psychiatric:        Mood and Affect: Mood normal.    ED Results / Procedures / Treatments   Labs (all labs ordered are listed, but only abnormal results are displayed) Labs Reviewed  COMPREHENSIVE METABOLIC PANEL - Abnormal; Notable for the following components:      Result Value   Creatinine, Ser 1.17 (*)    Total Protein 8.2 (*)    All other components within normal limits  CBC - Abnormal; Notable for the following components:   WBC 3.6 (*)    RBC 6.00 (*)    Hemoglobin 17.5 (*)    HCT 49.2 (*)    Platelets 83 (*)    All other components within normal limits  RESP PANEL BY RT-PCR (FLU A&B, COVID) ARPGX2  LIPASE, BLOOD  HCG, SERUM, QUALITATIVE  URINALYSIS, ROUTINE W REFLEX MICROSCOPIC    EKG None  Radiology No results found.  Procedures Procedures  {Document cardiac monitor, telemetry  assessment procedure when appropriate:1}  Medications Ordered in ED Medications  sodium chloride 0.9 % bolus 1,000 mL (has no administration in time range)  ondansetron (ZOFRAN) injection 4 mg (has no administration in time range)    ED Course/ Medical Decision Making/ A&P Clinical Course as of 02/22/22 2237  Sun Feb 22, 2022  2233 Consulted Dr. Shirline Frees of hematology regarding patient's leukopenia and thrombocytopenia.  He recommended close follow-up with PCP in 1 to 2 weeks for repeat lab studies and sooner if patient becomes symptomatic.  No current hematology referral indicated at this time. [CR]    Clinical Course User Index [CR] Peter Garter, PA  Medical Decision Making Amount and/or Complexity of Data Reviewed Labs: ordered. Radiology: ordered.  Risk OTC drugs. Prescription drug management.   This patient presents to the ED for concern of nausea, vomiting, diarrhea, this involves an extensive number of treatment options, and is a complaint that carries with it a high risk of complications and morbidity.  The differential diagnosis includes ***   Co morbidities that complicate the patient evaluation  See HPI   Additional history obtained:  Additional history obtained from EMR External records from outside source obtained and reviewed including ***   Lab Tests:  I Ordered, and personally interpreted labs.  The pertinent results include:  ***   Imaging Studies ordered:  I ordered imaging studies including ***  I independently visualized and interpreted imaging which showed *** I agree with the radiologist interpretation   Cardiac Monitoring: / EKG:  The patient was maintained on a cardiac monitor.  I personally viewed and interpreted the cardiac monitored which showed an underlying rhythm of: ***   Consultations Obtained:  I requested consultation with the ***,  and discussed lab and imaging findings as well as pertinent  plan - they recommend: ***   Problem List / ED Course / Critical interventions / Medication management  *** I ordered medication including ***  for ***  Reevaluation of the patient after these medicines showed that the patient {resolved/improved/worsened:23923::"improved"} I have reviewed the patients home medicines and have made adjustments as needed   Social Determinants of Health:  ***   Test / Admission - Considered:  Vitals signs significant for ***. Otherwise within normal range and stable throughout visit. Laboratory/imaging studies significant for: *** *** Worrisome signs and symptoms were discussed with the patient, and the patient acknowledged understanding to return to the ED if noticed. Patient was stable upon discharge.    {Document critical care time when appropriate:1} {Document review of labs and clinical decision tools ie heart score, Chads2Vasc2 etc:1}  {Document your independent review of radiology images, and any outside records:1} {Document your discussion with family members, caretakers, and with consultants:1} {Document social determinants of health affecting pt's care:1} {Document your decision making why or why not admission, treatments were needed:1} Final Clinical Impression(s) / ED Diagnoses Final diagnoses:  None    Rx / DC Orders ED Discharge Orders     None

## 2022-02-22 NOTE — ED Notes (Signed)
Dr. Mayra Neer notified that pt is wanting an update and POC at this time as he been waiting.

## 2022-02-22 NOTE — ED Notes (Signed)
Pt c/o headache 8/10, Dr. Mayra Neer notified of pt's request for headache medication, verbal order for Tylenol, refer to [facility]

## 2022-02-22 NOTE — Discharge Instructions (Signed)
As your symptoms are likely secondary to viral gastroenteritis.  We will treat the nausea/vomiting with Zofran to take as needed.  With the diarrhea run its course until completed.  2 of your blood studies are abnormal and need repeating in 1 to 2 weeks by your primary care.  Please schedule an appointment tomorrow.  Watch for signs such as easy bleeding, rash.  Please not hesitate to return to emergency department for worrisome signs and symptoms we discussed become apparent.

## 2022-02-22 NOTE — ED Notes (Signed)
Patient transported to CT 

## 2022-03-05 DIAGNOSIS — D696 Thrombocytopenia, unspecified: Secondary | ICD-10-CM | POA: Diagnosis not present

## 2022-03-05 DIAGNOSIS — D72819 Decreased white blood cell count, unspecified: Secondary | ICD-10-CM | POA: Diagnosis not present

## 2022-03-17 DIAGNOSIS — K580 Irritable bowel syndrome with diarrhea: Secondary | ICD-10-CM | POA: Diagnosis not present

## 2022-03-17 DIAGNOSIS — Z789 Other specified health status: Secondary | ICD-10-CM | POA: Diagnosis not present

## 2022-03-17 DIAGNOSIS — D751 Secondary polycythemia: Secondary | ICD-10-CM | POA: Diagnosis not present

## 2022-03-17 DIAGNOSIS — F640 Transsexualism: Secondary | ICD-10-CM | POA: Diagnosis not present

## 2022-03-17 DIAGNOSIS — Z13220 Encounter for screening for lipoid disorders: Secondary | ICD-10-CM | POA: Diagnosis not present

## 2022-03-17 DIAGNOSIS — Z0000 Encounter for general adult medical examination without abnormal findings: Secondary | ICD-10-CM | POA: Diagnosis not present

## 2022-04-10 DIAGNOSIS — S63641D Sprain of metacarpophalangeal joint of right thumb, subsequent encounter: Secondary | ICD-10-CM | POA: Diagnosis not present

## 2022-04-10 DIAGNOSIS — M79644 Pain in right finger(s): Secondary | ICD-10-CM | POA: Diagnosis not present

## 2022-08-11 ENCOUNTER — Other Ambulatory Visit: Payer: Self-pay

## 2022-08-11 ENCOUNTER — Emergency Department (HOSPITAL_BASED_OUTPATIENT_CLINIC_OR_DEPARTMENT_OTHER)
Admission: EM | Admit: 2022-08-11 | Discharge: 2022-08-11 | Disposition: A | Discharge status: Home / Self Care | Payer: BC Managed Care – PPO | Attending: Emergency Medicine | Admitting: Emergency Medicine

## 2022-08-11 ENCOUNTER — Encounter (HOSPITAL_BASED_OUTPATIENT_CLINIC_OR_DEPARTMENT_OTHER): Payer: Self-pay

## 2022-08-11 ENCOUNTER — Emergency Department (HOSPITAL_BASED_OUTPATIENT_CLINIC_OR_DEPARTMENT_OTHER): Payer: BC Managed Care – PPO

## 2022-08-11 DIAGNOSIS — J45909 Unspecified asthma, uncomplicated: Secondary | ICD-10-CM | POA: Insufficient documentation

## 2022-08-11 DIAGNOSIS — W108XXA Fall (on) (from) other stairs and steps, initial encounter: Secondary | ICD-10-CM | POA: Diagnosis not present

## 2022-08-11 DIAGNOSIS — R519 Headache, unspecified: Secondary | ICD-10-CM | POA: Insufficient documentation

## 2022-08-11 DIAGNOSIS — R55 Syncope and collapse: Secondary | ICD-10-CM | POA: Diagnosis not present

## 2022-08-11 DIAGNOSIS — S199XXA Unspecified injury of neck, initial encounter: Secondary | ICD-10-CM | POA: Diagnosis not present

## 2022-08-11 DIAGNOSIS — S0990XA Unspecified injury of head, initial encounter: Secondary | ICD-10-CM | POA: Diagnosis not present

## 2022-08-11 NOTE — ED Triage Notes (Signed)
Patient states fell down 1/2 flight of stairs after pant leg got caught under foot  denies Loc. C/o headache 7/10 However, prior to this event states has been "passing out" on Monday morning with vomiting episodes. Continues to be nauseous without abdominal pain. Denies neck pain. States some thoracic pain "scraped the carpet" Alert and oriented at present.

## 2022-08-11 NOTE — ED Provider Notes (Signed)
[facility] Provider Note: Georgena Spurling, MD, FACEP  CSN: [id number] MRN: [id number] ARRIVAL: 08/11/22 at [place]: [id number]   CHIEF COMPLAINT  Fall   HISTORY OF PRESENT ILLNESS  08/11/22 5:27 AM [name] is a 22 y.o. adult who tripped on the stairs this morning just prior to arrival and fell about half a flight.  The patient has a history of multiple concussions in the past and is concerned about another concussion.  The patient's occiput was struck during the fall and the patient is now having a headache rated as a 7 out of 10.  There was no loss of consciousness and the patient has not been vomiting since the fall.  The patient has a longstanding history of intermittent vomiting often accompanied by fainting episodes.  The patient has been evaluated by cardiologist and PCP for this without a definitive diagnosis, but POTS has been suggested.  Echocardiogram and stress test were reassuring.   Past Medical History:  Diagnosis Date   ADHD    Anxiety    Asthma    B12 deficiency    C. 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    Gallbladder sludge    Post concussion syndrome    Severe depression     Past Surgical History:  Procedure Laterality Date   BIOPSY  12/04/2019   Procedure: BIOPSY;  Surgeon: Lavena Bullion, DO;  Location: [facility];  Service: Gastroenterology;;   brain cyst removal     CHOLECYSTECTOMY     COLONOSCOPY WITH PROPOFOL N/A 12/04/2019   Procedure: COLONOSCOPY WITH PROPOFOL;  Surgeon: Lavena Bullion, DO;  [facility];  Service: Gastroenterology;  Laterality: N/A;   ESOPHAGOGASTRODUODENOSCOPY (EGD) WITH PROPOFOL N/A 12/04/2019   Procedure: ESOPHAGOGASTRODUODENOSCOPY (EGD) WITH PROPOFOL;  Surgeon: Lavena Bullion, DO;  [facility];  Service: Gastroenterology;  Laterality: N/A;   LIGAMENT REPAIR Left    radial   thumb surgery     ULNAR NERVE REPAIR Left 09/11/2019   WISDOM TOOTH EXTRACTION       Family History  Problem Relation Age of Onset   Seizures Maternal Aunt    Heart disease Paternal Aunt    Diabetes Maternal Grandmother    Heart disease Maternal Grandmother    Heart disease Maternal Grandfather    Deep vein thrombosis Maternal Grandfather    Hyperlipidemia Maternal Grandfather    Stomach cancer Paternal Grandfather    Colon cancer Neg Hx    Esophageal cancer Neg Hx    Pancreatic cancer Neg Hx     Social History   Tobacco Use   Smoking status: Never   Smokeless tobacco: Never  Vaping Use   Vaping Use: Some days   Substances: Nicotine, THC, Flavoring  Substance Use Topics   Alcohol use: Yes    Comment: occasionally   Drug use: Yes    Types: Marijuana    Prior to Admission medications   Medication Sig Start Date End Date Taking? Authorizing Provider  albuterol (PROVENTIL HFA;VENTOLIN HFA) 108 (90 Base) MCG/ACT inhaler Inhale 2 puffs into the lungs every 6 (six) hours as needed for wheezing or shortness of breath.    [provider]  cyanocobalamin 1000 MCG tablet Take 1 tablet by mouth daily. 04/04/18   [provider]  ondansetron (ZOFRAN) 4 MG tablet Take 1 tablet (4 mg total) by mouth every 6 (six) hours. 02/22/22   Dion Saucier A, PA  ondansetron (ZOFRAN-ODT) 8 MG disintegrating tablet Take 1 tablet (8 mg  total) by mouth every 8 (eight) hours as needed. 12/11/20   Isla Pence, MD  TESTOSTERONE CYPIONATE IM Inject 75 mg into the skin once a week. Sunday 08/11/18   [provider]    Allergies Bee pollen, Mold extract [trichophyton], Pollen extract, Zyrtec [cetirizine], Egg-derived products, and Mixed ragweed   REVIEW OF SYSTEMS  Negative except as noted here or in the History of Present Illness.   PHYSICAL EXAMINATION  Initial Vital Signs Blood pressure (!) 129/100, pulse (!) 54, temperature 98.2 F (36.8 C), temperature source Oral, resp. rate 18, height 5\' 9"  (1.753 m), weight 108.9 kg, SpO2 100  %.  Examination General: Well-developed, well-nourished adult in no acute distress; appearance consistent with age of record HENT: normocephalic; occipital tenderness Eyes: pupils equal, round and reactive to light; extraocular muscles intact Neck: supple; posterior tenderness Heart: regular rate and rhythm Lungs: clear to auscultation bilaterally Abdomen: soft; nondistended; nontender; bowel sounds present Extremities: No deformity; full range of motion; pulses normal Neurologic: Awake, alert and oriented; motor function intact in all extremities and symmetric; no facial droop Skin: Warm and dry Psychiatric: Flat affect   RESULTS  Summary of this visit's results, reviewed and interpreted by myself:   EKG Interpretation  Date/Time:    Ventricular Rate:    PR Interval:    QRS Duration:   QT Interval:    QTC Calculation:   R Axis:     Text Interpretation:         Laboratory Studies: No results found for this or any previous visit (from the past 24 hour(s)). Imaging Studies: CT Head Wo Contrast  Result Date: 08/11/2022 CLINICAL DATA:  Syncope with moderate to severe head trauma. EXAM: CT HEAD WITHOUT CONTRAST CT CERVICAL SPINE WITHOUT CONTRAST TECHNIQUE: Multidetector CT imaging of the head and cervical spine was performed following the standard protocol without intravenous contrast. Multiplanar CT image reconstructions of the cervical spine were also generated. RADIATION DOSE REDUCTION: This exam was performed according to the departmental dose-optimization program which includes automated exposure control, adjustment of the mA and/or kV according to patient size and/or use of iterative reconstruction technique. COMPARISON:  None Available. FINDINGS: CT HEAD FINDINGS Brain: No evidence of acute infarction, hemorrhage, hydrocephalus, extra-axial collection or mass lesion/mass effect. Vascular: No hyperdense vessel or unexpected calcification. Skull: Normal. Negative for fracture or  focal lesion. Sinuses/Orbits: No acute finding. CT CERVICAL SPINE FINDINGS Alignment: Normal. Skull base and vertebrae: No acute fracture. No primary bone lesion or focal pathologic process. Soft tissues and spinal canal: No prevertebral fluid or swelling. No visible canal hematoma. Disc levels:  No degenerative changes Upper chest: Negative IMPRESSION: No evidence of intracranial or cervical spine injury. Electronically Signed   By: Jorje Guild M.D.   On: 08/11/2022 06:08   CT Cervical Spine Wo Contrast  Result Date: 08/11/2022 CLINICAL DATA:  Syncope with moderate to severe head trauma. EXAM: CT HEAD WITHOUT CONTRAST CT CERVICAL SPINE WITHOUT CONTRAST TECHNIQUE: Multidetector CT imaging of the head and cervical spine was performed following the standard protocol without intravenous contrast. Multiplanar CT image reconstructions of the cervical spine were also generated. RADIATION DOSE REDUCTION: This exam was performed according to the departmental dose-optimization program which includes automated exposure control, adjustment of the mA and/or kV according to patient size and/or use of iterative reconstruction technique. COMPARISON:  None Available. FINDINGS: CT HEAD FINDINGS Brain: No evidence of acute infarction, hemorrhage, hydrocephalus, extra-axial collection or mass lesion/mass effect. Vascular: No hyperdense vessel or unexpected calcification. Skull: Normal.  Negative for fracture or focal lesion. Sinuses/Orbits: No acute finding. CT CERVICAL SPINE FINDINGS Alignment: Normal. Skull base and vertebrae: No acute fracture. No primary bone lesion or focal pathologic process. Soft tissues and spinal canal: No prevertebral fluid or swelling. No visible canal hematoma. Disc levels:  No degenerative changes Upper chest: Negative IMPRESSION: No evidence of intracranial or cervical spine injury. Electronically Signed   By: Jorje Guild M.D.   On: 08/11/2022 06:08    ED COURSE and MDM  Nursing notes,  initial and subsequent vitals signs, including pulse oximetry, reviewed and interpreted by myself.  Vitals:   08/11/22 0517 08/11/22 0524 08/11/22 0530 08/11/22 0545  BP:  (!) 129/100 115/86 113/78  Pulse:  (!) 54 (!) 52 (!) 55  Resp:  18 16 15   Temp:  98.2 F (36.8 C)    TempSrc:  Oral    SpO2:  100% 100% 98%  Weight: 108.9 kg     Height: 5\' 9"  (1.753 m)      Medications - No data to display  No serious injury seen on CT scans.  As noted above the patient is being worked up for the vomiting and fainting spells.  I suspect the fainting is vasovagal related to the vomiting.  PROCEDURES  Procedures   ED DIAGNOSES     ICD-10-CM   1. Fall down stairs, initial encounter  W10.8XXA          Nita Whitmire, Jenny Reichmann, MD 08/11/22 [phone]

## 2022-08-11 NOTE — ED Notes (Signed)
Patient to Ct via wc

## 2022-08-14 DIAGNOSIS — S060X0A Concussion without loss of consciousness, initial encounter: Secondary | ICD-10-CM | POA: Diagnosis not present

## 2022-09-15 DIAGNOSIS — R630 Anorexia: Secondary | ICD-10-CM | POA: Diagnosis not present

## 2022-09-15 DIAGNOSIS — F418 Other specified anxiety disorders: Secondary | ICD-10-CM | POA: Diagnosis not present

## 2022-09-15 DIAGNOSIS — D72819 Decreased white blood cell count, unspecified: Secondary | ICD-10-CM | POA: Diagnosis not present

## 2022-09-15 DIAGNOSIS — R42 Dizziness and giddiness: Secondary | ICD-10-CM | POA: Diagnosis not present

## 2022-09-15 DIAGNOSIS — D696 Thrombocytopenia, unspecified: Secondary | ICD-10-CM | POA: Diagnosis not present

## 2022-09-15 DIAGNOSIS — R197 Diarrhea, unspecified: Secondary | ICD-10-CM | POA: Diagnosis not present

## 2022-09-15 DIAGNOSIS — J4520 Mild intermittent asthma, uncomplicated: Secondary | ICD-10-CM | POA: Diagnosis not present

## 2022-09-15 DIAGNOSIS — F908 Attention-deficit hyperactivity disorder, other type: Secondary | ICD-10-CM | POA: Diagnosis not present

## 2022-09-15 DIAGNOSIS — F640 Transsexualism: Secondary | ICD-10-CM | POA: Diagnosis not present

## 2022-09-15 DIAGNOSIS — D751 Secondary polycythemia: Secondary | ICD-10-CM | POA: Diagnosis not present

## 2022-09-17 DIAGNOSIS — R197 Diarrhea, unspecified: Secondary | ICD-10-CM | POA: Diagnosis not present

## 2022-09-30 DIAGNOSIS — R197 Diarrhea, unspecified: Secondary | ICD-10-CM | POA: Diagnosis not present

## 2022-09-30 DIAGNOSIS — R112 Nausea with vomiting, unspecified: Secondary | ICD-10-CM | POA: Diagnosis not present

## 2022-09-30 DIAGNOSIS — R109 Unspecified abdominal pain: Secondary | ICD-10-CM | POA: Diagnosis not present

## 2022-10-27 DIAGNOSIS — E669 Obesity, unspecified: Secondary | ICD-10-CM | POA: Diagnosis not present

## 2022-10-27 DIAGNOSIS — Z6837 Body mass index (BMI) 37.0-37.9, adult: Secondary | ICD-10-CM | POA: Diagnosis not present

## 2022-10-27 DIAGNOSIS — Z713 Dietary counseling and surveillance: Secondary | ICD-10-CM | POA: Diagnosis not present

## 2022-11-12 DIAGNOSIS — F411 Generalized anxiety disorder: Secondary | ICD-10-CM | POA: Diagnosis not present

## 2022-11-17 DIAGNOSIS — F411 Generalized anxiety disorder: Secondary | ICD-10-CM | POA: Diagnosis not present

## 2022-11-24 DIAGNOSIS — F411 Generalized anxiety disorder: Secondary | ICD-10-CM | POA: Diagnosis not present

## 2022-12-01 DIAGNOSIS — F411 Generalized anxiety disorder: Secondary | ICD-10-CM | POA: Diagnosis not present

## 2022-12-08 DIAGNOSIS — Z6837 Body mass index (BMI) 37.0-37.9, adult: Secondary | ICD-10-CM | POA: Diagnosis not present

## 2022-12-08 DIAGNOSIS — E669 Obesity, unspecified: Secondary | ICD-10-CM | POA: Diagnosis not present

## 2022-12-15 DIAGNOSIS — F411 Generalized anxiety disorder: Secondary | ICD-10-CM | POA: Diagnosis not present

## 2022-12-19 NOTE — Progress Notes (Unsigned)
[facility]  PATIENT: [name] DOB: [birth year]  REFERRING DOCTOR OR PCP:  Ardean Larsen, MD SOURCE: Patient, notes from primary care, notes from orthopedics and neurology (2018-2022), EMG from 2020 reviewed.  _________________________________   HISTORICAL  CHIEF COMPLAINT:  Chief Complaint  Patient presents with   New Patient (Initial Visit)    Pt in room 11. New patient here for Ulnar neuropathy. Pt reports pain in hands, worse at night, tingling and numbness going on for about 4-5 years. Patient reports lower back pain that shoots down right side. Patient reports not being able to work due to current problems.     HISTORY OF PRESENT ILLNESS:  I had the pleasure of seeing your patient, [name], at [facility] for neurologic consultation regarding ulnar neuropathy.  He is a 22 year old transgender female who has a history of ulnar nerve surgery on the right in 2015 and on the left in 2016. He was in [employer] at the time and was having right > left arm and he had a NCV/EMG and MRI of the arm.     These were performed at [facility].  He had a thumb injury on the left requiring surgery in 2018.   Due to recurrent left elbow pain and sensory symptoms radiating into the fourth and fifth digits a repeat EMG and EMG ultrasound were performed 04/25/2019.  The ultrasound showed mild focal enlargement of the ulnar nerve at the ulnar groove.  Nerve conduction studies were normal at that time.  Symptoms improved with a revision of the left ulnar nerve.   .  Over the last 2 years he is having more pain in the right arm and more recently on the left.    He has pain constantly (3/10) intensifying at night to a 7/10.   Pain also intensifies with lifting.  He notes twitching in the right ADM muscle   His last NCV/EMG ws in 2020.  He last saw Orthopedics at [facility] (Dr. Mina Marble) about right thumb injury.        Studies: NCV/EMG  and U/S (Dr. 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    REVIEW OF SYSTEMS: Constitutional: No fevers, chills, sweats, or change in appetite Eyes: No visual changes, double vision, eye pain Ear, nose and throat: No hearing loss, ear pain, nasal congestion, sore throat Cardiovascular: No chest pain, palpitations Respiratory:  No shortness of breath at rest or with exertion.   No wheezes GastrointestinaI: No nausea, vomiting, diarrhea, abdominal pain, fecal incontinence Genitourinary:  No dysuria, urinary retention or frequency.  No nocturia. Musculoskeletal:  No neck pain, back pain Integumentary: No rash, pruritus, skin lesions Neurological: as above Psychiatric: No depression at this time.  No anxiety Endocrine: No palpitations, diaphoresis, change in appetite, change in weigh or increased thirst Hematologic/Lymphatic:  No anemia, purpura, petechiae. Allergic/Immunologic: No itchy/runny eyes, nasal congestion, recent allergic reactions, rashes  ALLERGIES: Allergies  Allergen Reactions   Bee Pollen Anaphylaxis   Mold Extract [Trichophyton] Anaphylaxis and Itching   Pollen Extract Other (See Comments)    Runny nose   Zyrtec [Cetirizine] Anaphylaxis   Egg-Derived Products Nausea And Vomiting   Mixed Ragweed Itching    HOME MEDICATIONS:  Current Outpatient Medications:    albuterol (PROVENTIL HFA;VENTOLIN HFA) 108 (90 Base) MCG/ACT inhaler, Inhale 2 puffs into the lungs every 6 (six) hours as needed for wheezing or shortness of breath., Disp: , Rfl:    cyanocobalamin 1000 MCG tablet, Take  1 tablet by mouth daily., Disp: , Rfl:    lamoTRIgine (LAMICTAL) 25 MG tablet, Titrate up to two po bid, Disp: 120 tablet, Rfl: 5   ondansetron (ZOFRAN) 4 MG tablet, Take 1 tablet (4 mg total) by mouth every 6 (six) hours., Disp: 12 tablet, Rfl: 0   ondansetron (ZOFRAN-ODT) 8 MG disintegrating tablet, Take 1 tablet (8 mg total) by mouth every 8 (eight) hours as needed., Disp: 20 tablet, Rfl:  0   oxycodone-acetaminophen (PERCOCET) 2.5-325 MG tablet, Take 1 tablet by mouth every 4 (four) hours as needed for pain. Takes only if needed for extreme pain, Disp: , Rfl:    TESTOSTERONE CYPIONATE IM, Inject 75 mg into the skin once a week. Sunday, Disp: , Rfl:   PAST MEDICAL HISTORY: Past Medical History:  Diagnosis Date   ADHD    Anxiety    Asthma    B12 deficiency    C. 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    Gallbladder sludge    Post concussion syndrome    Severe depression (HCC)     PAST SURGICAL HISTORY: Past Surgical History:  Procedure Laterality Date   BIOPSY  12/04/2019   Procedure: BIOPSY;  Surgeon: Shellia Cleverly, DO;  [facility]: [facility];  Service: Gastroenterology;;   brain cyst removal     CHOLECYSTECTOMY     COLONOSCOPY WITH PROPOFOL N/A 12/04/2019   Procedure: COLONOSCOPY WITH PROPOFOL;  Surgeon: Shellia Cleverly, DO;  Location: [facility];  Service: Gastroenterology;  Laterality: N/A;   ESOPHAGOGASTRODUODENOSCOPY (EGD) WITH PROPOFOL N/A 12/04/2019   Procedure: ESOPHAGOGASTRODUODENOSCOPY (EGD) WITH PROPOFOL;  Surgeon: Shellia Cleverly, DO;  Location: [facility];  Service: Gastroenterology;  Laterality: N/A;   LIGAMENT REPAIR Left    radial   thumb surgery     ULNAR NERVE REPAIR Left 09/11/2019   WISDOM TOOTH EXTRACTION      FAMILY HISTORY: Family History  Problem Relation Age of Onset   Seizures Maternal Aunt    Heart disease Paternal Aunt    Diabetes Maternal Grandmother    Heart disease Maternal Grandmother    Heart disease Maternal Grandfather    Deep vein thrombosis Maternal Grandfather    Hyperlipidemia Maternal Grandfather    Stomach cancer Paternal Grandfather    Colon cancer Neg Hx    Esophageal cancer Neg Hx    Pancreatic cancer Neg Hx     SOCIAL HISTORY: Social History   Socioeconomic History   Marital status: Single    Spouse name: Not on file   Number of children: Not on file   Years of  education: Not on file   Highest education level: Not on file  Occupational History   Not on file  Tobacco Use   Smoking status: Never   Smokeless tobacco: Never  Vaping Use   Vaping status: Some Days  Substance and Sexual Activity   Alcohol use: Yes    Comment: occasionally   Drug use: Yes    Types: Marijuana    Comment: one per day   Sexual activity: Not Currently  Other Topics Concern   Not on file  Social History Narrative   Right handed    Wears glasses    Rare soda usage.   Social Determinants of Health   Financial Resource Strain: Not on File (09/26/2018)   Received from [employer] [facility], [employer]    Financial Resource Strain: 0  Food Insecurity: Not on File (09/26/2018)   Received from [place], [place]  Food Insecurity    Food: 0  Transportation Needs: Not on File (09/26/2018)   Received from [facility], [employer] Needs    Transportation: 0  Physical Activity: Not on File (09/26/2018)   Received from [place], [place]   Physical Activity    Physical Activity: 0  Stress: Not on File (09/26/2018)   Received from [facility], [place]   Stress    Stress: 0  Social Connections: Not on File (09/26/2018)   Received from [place], [place]   Social Connections    Social Connections and Isolation: 0  Intimate Partner Violence: Not on file       PHYSICAL EXAM  Vitals:   12/21/22 0952  BP: 126/85  Pulse: (!) 59  Weight: 256 lb 3.2 oz (116.2 kg)  Height: 5\' 9"  (1.753 m)    Body mass index is 37.83 kg/m.   General: The patient is well-developed and well-nourished and in no acute distress  HEENT:  Head is [place]/AT.  Sclera are anicteric.    Neck: No carotid bruits are noted.  The neck is nontender.  Cardiovascular: The heart has a regular rate and rhythm with a normal S1 and S2. There were no murmurs, gallops or rubs.    Skin: Extremities are without rash or  edema.  Musculoskeletal:  Back is nontender  Neurologic Exam  Mental status: The patient is  alert and oriented x 3 at the time of the examination. The patient has apparent normal recent and remote memory, with an apparently normal attention span and concentration ability.   Speech is normal.  Cranial nerves: Extraocular movements are full. There is good facial sensation to soft touch bilaterally.Facial strength is normal.  Trapezius and sternocleidomastoid strength is normal. No dysarthria is noted.  The tongue is midline, and the patient has symmetric elevation of the soft palate. No obvious hearing deficits are noted.  Motor:  Muscle bulk is normal.   Tone is normal. Strength is  4+/5 in left IO muscles and 5/5 on right.  Strength is 4/5 in APB muscles bilaterally.  5/5 elsewhere  Sensory: Sensory testing.   He reports reduced pinprick over 4th and 5th fingers and adjacent palm bilaterally, worse on right.    Other:   Tinel's signs at both wrists going to first 3 fingers.  Phalen's negative.  Mild Tinel's signs at elbows over ulnar nerve.    Coordination: Cerebellar testing reveals good finger-nose-finger and heel-to-shin bilaterally.  Gait and station: Station is normal.   Gait is normal. Tandem gait is normal. Romberg is negative.   Reflexes: Deep tendon reflexes are symmetric and normal bilaterally.   Plantar responses are flexor.    DIAGNOSTIC DATA (LABS, IMAGING, TESTING) - I reviewed patient records, labs, notes, testing and imaging myself where available.  Lab Results  Component Value Date   WBC 3.8 (L) 02/22/2022   HGB 14.6 02/22/2022   HCT 41.9 02/22/2022   MCV 81.8 02/22/2022   PLT 74 (L) 02/22/2022      Component Value Date/Time   NA 137 02/22/2022 1229   K 3.5 02/22/2022 1229   CL 102 02/22/2022 1229   CO2 23 02/22/2022 1229   GLUCOSE 99 02/22/2022 1229   BUN 10 02/22/2022 1229   CREATININE 1.17 (H) 02/22/2022 1229   CALCIUM 9.5 02/22/2022 1229   PROT 8.2 (H) 02/22/2022 1229   ALBUMIN 4.8 02/22/2022 1229   AST 30 02/22/2022 1229   ALT 27 02/22/2022  1229   ALKPHOS 53 02/22/2022 1229  BILITOT 0.7 02/22/2022 1229   GFRNONAA >60 02/22/2022 1229   GFRAA >60 12/05/2019 0555       ASSESSMENT AND PLAN  Ulnar neuropathy at elbow, unspecified laterality - Plan: NCV with EMG(electromyography)  Median nerve neuropathy, unspecified laterality - Plan: NCV with EMG(electromyography)  Pain in both upper extremities - Plan: NCV with EMG(electromyography)   In summary, this 22 year old patient has bilateral arm pain, worse with lifting and at night.  He has a history of bilateral ulnar nerve operations.  He has numbness on exam in the ulnar distribution.  However, he has Tinel signs that are worse over the median nerves and mild APB weakness.  Therefore there could also be carpal tunnel syndrome.  To further sort out whether the median or ulnar nerve is playing a bigger role in the pain we will check an NCV/EMG study.  If significant findings would recommend referral to orthopedic or neurosurgery for procedure.  Gabapentin was not well-tolerated in the past.  Because of the nerve pain I will start lamotrigine and titrate up to 50 mg twice daily.  If well-tolerated, this dose can slowly be titrated up as well  We we will let him know the results of the NCV/EMG study and refer appropriately.  No follow-up is needed.  He will return to see [place] as needed for new or worsening symptoms.  Thank you for asking [place] to see this patient.  Please let me know if I can be of further assistance with him or other patients in the future.    Trayvond Viets A. Epimenio Foot, MD, [facility] 12/21/2022, 11:02 AM Certified in Neurology, Clinical Neurophysiology, Sleep Medicine and Neuroimaging  [facility] [address] [place], [place] [address] [phone]

## 2022-12-21 ENCOUNTER — Ambulatory Visit (INDEPENDENT_AMBULATORY_CARE_PROVIDER_SITE_OTHER): Payer: BC Managed Care – PPO | Admitting: Neurology

## 2022-12-21 ENCOUNTER — Encounter: Payer: Self-pay | Admitting: Neurology

## 2022-12-21 ENCOUNTER — Other Ambulatory Visit: Payer: Self-pay | Admitting: Neurology

## 2022-12-21 VITALS — BP 126/85 | HR 59 | Ht 69.0 in | Wt 256.2 lb

## 2022-12-21 DIAGNOSIS — M79602 Pain in left arm: Secondary | ICD-10-CM | POA: Diagnosis not present

## 2022-12-21 DIAGNOSIS — G5610 Other lesions of median nerve, unspecified upper limb: Secondary | ICD-10-CM

## 2022-12-21 DIAGNOSIS — M79601 Pain in right arm: Secondary | ICD-10-CM

## 2022-12-21 DIAGNOSIS — G5620 Lesion of ulnar nerve, unspecified upper limb: Secondary | ICD-10-CM | POA: Diagnosis not present

## 2022-12-21 MED ORDER — LAMOTRIGINE 25 MG PO TABS: ORAL_TABLET | ORAL | 5 refills | Status: DC

## 2022-12-21 NOTE — Telephone Encounter (Signed)
Pharmacy sent fax, asked for directions for patient lamotrigine   Per note " 1 week take 1 pill once a day. The second week, take 1 pill twice a day. The third week, take 1 pill in the morning and 2 at bedtime or evening. The fourth week take 2 pills twice a day."  Attached to Rx and sent in.

## 2022-12-21 NOTE — Patient Instructions (Signed)
The pharmacy has the prescription of lamotrigine 25 mg. Please take it as follows: For 1 week take 1 pill once a day. The second week, take 1 pill twice a day. The third week, take 1 pill in the morning and 2 at bedtime or evening. The fourth week take 2 pills twice a day.  Most people tolerate lamotrigine very well. Some will get a rash.  If you do get a significant rash stop the medicine immediately and let us know.  

## 2022-12-22 DIAGNOSIS — F411 Generalized anxiety disorder: Secondary | ICD-10-CM | POA: Diagnosis not present

## 2022-12-24 ENCOUNTER — Other Ambulatory Visit: Payer: Self-pay

## 2022-12-24 MED ORDER — LAMOTRIGINE 25 MG PO TABS: ORAL_TABLET | ORAL | 5 refills | Status: DC

## 2022-12-29 DIAGNOSIS — F411 Generalized anxiety disorder: Secondary | ICD-10-CM | POA: Diagnosis not present

## 2023-01-07 ENCOUNTER — Other Ambulatory Visit: Payer: Self-pay | Admitting: Neurology

## 2023-01-19 DIAGNOSIS — F411 Generalized anxiety disorder: Secondary | ICD-10-CM | POA: Diagnosis not present

## 2023-02-05 DIAGNOSIS — F411 Generalized anxiety disorder: Secondary | ICD-10-CM | POA: Diagnosis not present

## 2023-02-16 DIAGNOSIS — K529 Noninfective gastroenteritis and colitis, unspecified: Secondary | ICD-10-CM | POA: Diagnosis not present

## 2023-02-16 DIAGNOSIS — F411 Generalized anxiety disorder: Secondary | ICD-10-CM | POA: Diagnosis not present

## 2023-02-16 DIAGNOSIS — R110 Nausea: Secondary | ICD-10-CM | POA: Diagnosis not present

## 2023-02-17 ENCOUNTER — Ambulatory Visit (INDEPENDENT_AMBULATORY_CARE_PROVIDER_SITE_OTHER): Payer: BC Managed Care – PPO | Admitting: Neurology

## 2023-02-17 ENCOUNTER — Ambulatory Visit: Payer: BC Managed Care – PPO | Admitting: Neurology

## 2023-02-17 ENCOUNTER — Encounter: Payer: Self-pay | Admitting: Neurology

## 2023-02-17 VITALS — Ht 69.0 in | Wt 256.0 lb

## 2023-02-17 DIAGNOSIS — G5620 Lesion of ulnar nerve, unspecified upper limb: Secondary | ICD-10-CM

## 2023-02-17 DIAGNOSIS — M79601 Pain in right arm: Secondary | ICD-10-CM

## 2023-02-17 DIAGNOSIS — G5623 Lesion of ulnar nerve, bilateral upper limbs: Secondary | ICD-10-CM | POA: Diagnosis not present

## 2023-02-17 DIAGNOSIS — G5610 Other lesions of median nerve, unspecified upper limb: Secondary | ICD-10-CM

## 2023-02-17 NOTE — Procedures (Signed)
Full Name: [name] Gender: Female MRN #: [id number] Date of Birth: 20-Nov-2000    Visit Date: 02/17/2023 13:36 Age: 22 Years Examining Physician: Dr. Levert Feinstein Referring Physician: Dr. Despina Arias Height: 5 feet 9 inch History: 22 year old female presenting with recurrent left elbow discomfort, left hand difficulty, paresthesia, history of bilateral ulnar transpositional surgery in the past, redo for the left side, also had a history of left thumb injury  Summary of the test:  Nerve conduction study: Bilateral median, ulnar sensory and motor responses were normal.  Electromyography: Selected needle examination of bilateral upper extremity muscles and cervical paraspinal muscles were normal.  Conclusion: This is a normal study.  There is no electrodiagnostic evidence of upper extremity neuropathy or cervical radiculopathy.    ------------------------------- Forrestine Him.D.Ph.D.  [facility] [address], [address] [place], [place] [address] Tel: [phone] Fax: [phone]  Verbal informed consent was obtained from the patient, patient was informed of potential risk of procedure, including bruising, bleeding, hematoma formation, infection, muscle weakness, muscle pain, numbness, among others.        MNC    Nerve / Sites Muscle Latency Ref. Amplitude Ref. Rel Amp Segments Distance Velocity Ref. Area    ms ms mV mV %  cm m/s m/s mVms  R Median - APB     Wrist APB 3.3 <=4.4 9.7 >=4.0 100 Wrist - APB 7   35.0     Upper arm APB 7.6  9.7  99.9 Upper arm - Wrist 27 62 >=49 34.2  L Median - APB     Wrist APB 3.1 <=4.4 9.7 >=4.0 100 Wrist - APB 7   38.6     Upper arm APB 7.7  8.8  90 Upper arm - Wrist 28 61 >=49 36.1  R Ulnar - ADM     Wrist ADM 2.4 <=3.3 10.7 >=6.0 100 Wrist - ADM 7   37.6     B.Elbow ADM 4.3  9.1  84.6 B.Elbow - Wrist 12 65 >=49 32.4     A.Elbow ADM 7.1  10.1  111 A.Elbow - B.Elbow 17 59 >=49 36.4  L Ulnar - ADM     Wrist ADM  2.3 <=3.3 11.6 >=6.0 100 Wrist - ADM 7   44.4     B.Elbow ADM 4.2  8.8  75.6 B.Elbow - Wrist 13 67 >=49 34.9     A.Elbow ADM 7.1  10.7  122 A.Elbow - B.Elbow 19 65 >=49 43.0             SNC    Nerve / Sites Rec. Site Peak Lat Ref.  Amp Ref. Segments Distance Peak Diff Ref.    ms ms V V  cm ms ms  R Median, Ulnar - Transcarpal comparison     Median Palm Wrist 2.2 <=2.2 100 >=35 Median Palm - Wrist 8       Ulnar Palm Wrist 1.9 <=2.2 46 >=12 Ulnar Palm - Wrist 8          Median Palm - Ulnar Palm  0.3 <=0.4  L Median, Ulnar - Transcarpal comparison     Median Palm Wrist 2.2 <=2.2 120 >=35 Median Palm - Wrist 8       Ulnar Palm Wrist 1.8 <=2.2 37 >=12 Ulnar Palm - Wrist 8          Median Palm - Ulnar Palm  0.4 <=0.4  R Median - Orthodromic (Dig II, Mid palm)  Dig II Wrist 3.1 <=3.4 42 >=10 Dig II - Wrist 13    L Median - Orthodromic (Dig II, Mid palm)     Dig II Wrist 3.0 <=3.4 35 >=10 Dig II - Wrist 13    R Ulnar - Orthodromic, (Dig V, Mid palm)     Dig V Wrist 2.9 <=3.1 14 >=5 Dig V - Wrist 11    L Ulnar - Orthodromic, (Dig V, Mid palm)     Dig V Wrist 2.8 <=3.1 11 >=5 Dig V - Wrist 59                   F  Wave    Nerve F Lat Ref.   ms ms  R Ulnar - ADM 24.9 <=32.0  L Ulnar - ADM 25.2 <=32.0         EMG Summary Table    Spontaneous MUAP Recruitment  Muscle IA Fib PSW Fasc Other Amp Dur. Poly Pattern  R. First dorsal interosseous Normal None None None _______ Normal Normal Normal Normal  R. Pronator teres Normal None None None _______ Normal Normal Normal Normal  R. Extensor digitorum communis Normal None None None _______ Normal Normal Normal Normal  R. Brachioradialis Normal None None None _______ Normal Normal Normal Normal  R. Biceps brachii Normal None None None _______ Normal Normal Normal Normal  R. Deltoid Normal None None None _______ Normal Normal Normal Normal  R. Triceps brachii Normal None None None _______ Normal Normal Normal Normal  R. Cervical paraspinals  Normal None None None _______ Normal Normal Normal Normal  L. First dorsal interosseous Normal None None None _______ Normal Normal Normal Normal  L. Flexor digitorum profundus (Ulnar) Normal None None None _______ Normal Normal Normal Normal  L. Biceps brachii Normal None None None _______ Normal Normal Normal Normal  L. Deltoid Normal None None None _______ Normal Normal Normal Normal  L. Triceps brachii Normal None None None _______ Normal Normal Normal Normal  L. Cervical paraspinals Normal None None None _______ Normal Normal Normal Normal

## 2023-03-02 DIAGNOSIS — F411 Generalized anxiety disorder: Secondary | ICD-10-CM | POA: Diagnosis not present

## 2023-03-16 DIAGNOSIS — F411 Generalized anxiety disorder: Secondary | ICD-10-CM | POA: Diagnosis not present

## 2023-03-23 DIAGNOSIS — F418 Other specified anxiety disorders: Secondary | ICD-10-CM | POA: Diagnosis not present

## 2023-03-23 DIAGNOSIS — K580 Irritable bowel syndrome with diarrhea: Secondary | ICD-10-CM | POA: Diagnosis not present

## 2023-03-23 DIAGNOSIS — K529 Noninfective gastroenteritis and colitis, unspecified: Secondary | ICD-10-CM | POA: Diagnosis not present

## 2023-03-23 DIAGNOSIS — D751 Secondary polycythemia: Secondary | ICD-10-CM | POA: Diagnosis not present

## 2023-03-23 DIAGNOSIS — F908 Attention-deficit hyperactivity disorder, other type: Secondary | ICD-10-CM | POA: Diagnosis not present

## 2023-03-23 DIAGNOSIS — Z0000 Encounter for general adult medical examination without abnormal findings: Secondary | ICD-10-CM | POA: Diagnosis not present

## 2023-03-23 DIAGNOSIS — E669 Obesity, unspecified: Secondary | ICD-10-CM | POA: Diagnosis not present

## 2023-03-23 DIAGNOSIS — Z789 Other specified health status: Secondary | ICD-10-CM | POA: Diagnosis not present

## 2023-03-23 DIAGNOSIS — Z13220 Encounter for screening for lipoid disorders: Secondary | ICD-10-CM | POA: Diagnosis not present

## 2023-03-30 DIAGNOSIS — F411 Generalized anxiety disorder: Secondary | ICD-10-CM | POA: Diagnosis not present

## 2023-04-01 DIAGNOSIS — K529 Noninfective gastroenteritis and colitis, unspecified: Secondary | ICD-10-CM | POA: Diagnosis not present

## 2023-04-01 DIAGNOSIS — R109 Unspecified abdominal pain: Secondary | ICD-10-CM | POA: Diagnosis not present

## 2023-04-01 DIAGNOSIS — R112 Nausea with vomiting, unspecified: Secondary | ICD-10-CM | POA: Diagnosis not present

## 2023-04-06 DIAGNOSIS — G4719 Other hypersomnia: Secondary | ICD-10-CM | POA: Diagnosis not present

## 2023-04-27 DIAGNOSIS — F411 Generalized anxiety disorder: Secondary | ICD-10-CM | POA: Diagnosis not present

## 2023-05-11 DIAGNOSIS — F411 Generalized anxiety disorder: Secondary | ICD-10-CM | POA: Diagnosis not present

## 2023-10-05 ENCOUNTER — Other Ambulatory Visit (HOSPITAL_COMMUNITY): Payer: Self-pay | Admitting: Sports Medicine

## 2023-10-05 DIAGNOSIS — M25852 Other specified joint disorders, left hip: Secondary | ICD-10-CM

## 2023-10-13 ENCOUNTER — Ambulatory Visit (HOSPITAL_COMMUNITY)
Admission: RE | Admit: 2023-10-13 | Discharge: 2023-10-13 | Disposition: A | Discharge status: Home / Self Care | Source: Ambulatory Visit | Attending: Sports Medicine | Admitting: Sports Medicine

## 2023-10-13 ENCOUNTER — Other Ambulatory Visit (HOSPITAL_COMMUNITY): Payer: Self-pay | Admitting: Internal Medicine

## 2023-10-13 ENCOUNTER — Ambulatory Visit (HOSPITAL_COMMUNITY)
Admission: RE | Admit: 2023-10-13 | Discharge: 2023-10-13 | Disposition: A | Discharge status: Home / Self Care | Source: Ambulatory Visit | Attending: Internal Medicine

## 2023-10-13 DIAGNOSIS — M25852 Other specified joint disorders, left hip: Secondary | ICD-10-CM | POA: Insufficient documentation

## 2023-10-13 DIAGNOSIS — R52 Pain, unspecified: Secondary | ICD-10-CM

## 2023-10-13 MED ORDER — IOHEXOL 180 MG/ML  SOLN
10.0000 mL | Freq: Once | INTRAMUSCULAR | Status: AC | PRN
  Administered 2023-10-13: 10 mL via INTRA_ARTICULAR

## 2023-10-13 MED ORDER — LIDOCAINE HCL (PF) 1 % IJ SOLN
5.0000 mL | Freq: Once | INTRAMUSCULAR | Status: AC
  Administered 2023-10-13: 5 mL via INTRADERMAL

## 2023-10-13 MED ORDER — GADOBUTROL 1 MMOL/ML IV SOLN
0.0500 mL | Freq: Once | INTRAVENOUS | Status: AC | PRN
  Administered 2023-10-13: 0.05 mL

## 2023-10-13 MED ORDER — IOHEXOL 180 MG/ML  SOLN
10.0000 mL | Freq: Once | INTRAMUSCULAR | Status: AC
  Administered 2023-10-13: 10 mL via INTRA_ARTICULAR

## 2023-10-13 MED ORDER — SODIUM CHLORIDE (PF) 0.9% IJ SOLUTION - NO CHARGE
10.0000 mL | Freq: Once | INTRAMUSCULAR | Status: AC
  Administered 2023-10-13: 10 mL
  Filled 2023-10-13: qty 10

## 2023-10-13 MED ORDER — GADOBUTROL 1 MMOL/ML IV SOLN
0.0500 mL | Freq: Once | INTRAVENOUS | Status: AC
  Administered 2023-10-13: 0.05 mL

## 2023-10-13 NOTE — Procedures (Signed)
 Interventional Radiology Procedure Note  Risks and benefits of arthrogram were discussed with the patient including, but not limited to bleeding, infection, injection of contrast outside the joint, and damage to adjacent structures.  All of the patient's questions were answered, patient is agreeable to proceed. Consent signed and in chart.  A timeout was performed with all members of the team prior to start of the procedure. Correct patient and correct procedure was confirmed. Allergies were reviewed.   PROCEDURE SUMMARY:  Successful fluoro guided left hip arthrogram. No immediate complications.  Pt tolerated well.   EBL = none  Please see full dictation in imaging section of Epic for procedure details.   Electronically Signed: Lovena Rubinstein, PA-C 10/13/2023, 1:33 PM

## 2024-01-04 ENCOUNTER — Other Ambulatory Visit (HOSPITAL_COMMUNITY): Payer: Self-pay | Admitting: Sports Medicine

## 2024-01-04 DIAGNOSIS — M5459 Other low back pain: Secondary | ICD-10-CM

## 2024-01-15 ENCOUNTER — Ambulatory Visit (HOSPITAL_COMMUNITY)
Admission: RE | Admit: 2024-01-15 | Discharge: 2024-01-15 | Disposition: A | Discharge status: Home / Self Care | Source: Ambulatory Visit | Attending: Sports Medicine | Admitting: Sports Medicine

## 2024-01-15 DIAGNOSIS — M5459 Other low back pain: Secondary | ICD-10-CM | POA: Insufficient documentation
